# Patient Record
Sex: Female | Born: 1980 | Hispanic: No | Marital: Single | Smoking: Never smoker
Health system: Southern US, Community
[De-identification: ages and names within clinical notes are randomized; demographics above are authoritative.]

## PROBLEM LIST (undated history)

## (undated) DIAGNOSIS — R7303 Prediabetes: Principal | ICD-10-CM

## (undated) DIAGNOSIS — Z8759 Personal history of other complications of pregnancy, childbirth and the puerperium: Secondary | ICD-10-CM

## (undated) DIAGNOSIS — H35413 Lattice degeneration of retina, bilateral: Secondary | ICD-10-CM

## (undated) HISTORY — DX: Lattice degeneration of retina, bilateral: H35.413

## (undated) HISTORY — DX: Prediabetes: R73.03

## (undated) HISTORY — DX: Personal history of other complications of pregnancy, childbirth and the puerperium: Z87.59

## (undated) HISTORY — PX: NO PAST SURGERIES: SHX2092

---

## 2001-02-11 ENCOUNTER — Other Ambulatory Visit: Admission: RE | Admit: 2001-02-11 | Discharge: 2001-02-11 | Payer: Self-pay | Admitting: *Deleted

## 2001-09-10 ENCOUNTER — Other Ambulatory Visit: Admission: RE | Admit: 2001-09-10 | Discharge: 2001-09-10 | Payer: Self-pay | Admitting: Obstetrics and Gynecology

## 2003-04-14 ENCOUNTER — Other Ambulatory Visit: Admission: RE | Admit: 2003-04-14 | Discharge: 2003-04-14 | Payer: Self-pay | Admitting: Obstetrics and Gynecology

## 2004-04-25 ENCOUNTER — Other Ambulatory Visit: Admission: RE | Admit: 2004-04-25 | Discharge: 2004-04-25 | Payer: Self-pay | Admitting: Obstetrics and Gynecology

## 2005-05-02 ENCOUNTER — Other Ambulatory Visit: Admission: RE | Admit: 2005-05-02 | Discharge: 2005-05-02 | Payer: Self-pay | Admitting: Obstetrics and Gynecology

## 2006-02-07 ENCOUNTER — Ambulatory Visit: Payer: Self-pay | Admitting: Internal Medicine

## 2006-10-09 ENCOUNTER — Ambulatory Visit: Payer: Self-pay | Admitting: Internal Medicine

## 2006-10-09 LAB — CONVERTED CEMR LAB
AST: 21 units/L (ref 0–37)
Calcium: 9.7 mg/dL (ref 8.4–10.5)
Chlamydia, DNA Probe: NEGATIVE
Chloride: 102 meq/L (ref 96–112)
Cholesterol: 198 mg/dL (ref 0–200)
Creatinine, Ser: 0.8 mg/dL (ref 0.4–1.2)
GC Probe Amp, Genital: NEGATIVE
GFR calc non Af Amer: 91 mL/min
Glomerular Filtration Rate, Af Am: 111 mL/min/{1.73_m2}
Hemoglobin, Urine: NEGATIVE
Hemoglobin: 12.9 g/dL (ref 12.0–15.0)
Ketones, ur: NEGATIVE mg/dL
Nitrite: NEGATIVE
Protein, ur: NEGATIVE mg/dL
Sodium: 135 meq/L (ref 135–145)
TSH: 1.54 microintl units/mL (ref 0.35–5.50)
Urobilinogen, UA: 0.2 (ref 0.0–1.0)

## 2007-04-15 ENCOUNTER — Encounter (INDEPENDENT_AMBULATORY_CARE_PROVIDER_SITE_OTHER): Payer: Self-pay | Admitting: *Deleted

## 2007-04-15 ENCOUNTER — Ambulatory Visit: Payer: Self-pay | Admitting: Internal Medicine

## 2007-04-15 DIAGNOSIS — S139XXA Sprain of joints and ligaments of unspecified parts of neck, initial encounter: Secondary | ICD-10-CM | POA: Insufficient documentation

## 2007-04-17 ENCOUNTER — Telehealth (INDEPENDENT_AMBULATORY_CARE_PROVIDER_SITE_OTHER): Payer: Self-pay | Admitting: *Deleted

## 2007-04-29 ENCOUNTER — Ambulatory Visit: Payer: Self-pay | Admitting: Internal Medicine

## 2007-05-01 ENCOUNTER — Encounter: Payer: Self-pay | Admitting: Internal Medicine

## 2007-05-30 ENCOUNTER — Encounter: Payer: Self-pay | Admitting: Internal Medicine

## 2007-08-04 ENCOUNTER — Encounter: Payer: Self-pay | Admitting: Internal Medicine

## 2008-02-03 ENCOUNTER — Ambulatory Visit: Payer: Self-pay | Admitting: Internal Medicine

## 2008-02-03 DIAGNOSIS — B009 Herpesviral infection, unspecified: Secondary | ICD-10-CM | POA: Insufficient documentation

## 2008-02-03 LAB — CONVERTED CEMR LAB
Beta hcg, urine, semiquantitative: NEGATIVE
Bilirubin Urine: NEGATIVE
Ketones, urine, test strip: NEGATIVE
Specific Gravity, Urine: >=1.03
pH: 5

## 2008-02-04 ENCOUNTER — Encounter: Payer: Self-pay | Admitting: Internal Medicine

## 2008-02-04 LAB — CONVERTED CEMR LAB
Bacteria, UA: NONE SEEN
RBC / HPF: NONE SEEN (ref <3)

## 2008-05-04 ENCOUNTER — Ambulatory Visit: Payer: Self-pay | Admitting: Internal Medicine

## 2008-05-05 ENCOUNTER — Encounter (INDEPENDENT_AMBULATORY_CARE_PROVIDER_SITE_OTHER): Payer: Self-pay | Admitting: *Deleted

## 2008-05-05 LAB — CONVERTED CEMR LAB
BUN: 10 mg/dL (ref 6–23)
Chloride: 104 meq/L (ref 96–112)
Direct LDL: 132.6 mg/dL
GFR calc Af Amer: 96 mL/min
GFR calc non Af Amer: 79 mL/min
Glucose, Bld: 89 mg/dL (ref 70–99)
Potassium: 4.8 meq/L (ref 3.5–5.1)
Sodium: 138 meq/L (ref 135–145)
TSH: 1.93 microintl units/mL (ref 0.35–5.50)
VLDL: 13 mg/dL (ref 0–40)
Vit D, 1,25-Dihydroxy: 9 — ABNORMAL LOW (ref 30–89)

## 2008-05-10 ENCOUNTER — Telehealth (INDEPENDENT_AMBULATORY_CARE_PROVIDER_SITE_OTHER): Payer: Self-pay | Admitting: *Deleted

## 2008-05-11 ENCOUNTER — Ambulatory Visit: Payer: Self-pay | Admitting: Internal Medicine

## 2009-09-25 ENCOUNTER — Encounter: Payer: Self-pay | Admitting: Internal Medicine

## 2010-08-30 DIAGNOSIS — R7303 Prediabetes: Secondary | ICD-10-CM

## 2010-08-30 DIAGNOSIS — E119 Type 2 diabetes mellitus without complications: Secondary | ICD-10-CM | POA: Insufficient documentation

## 2010-08-30 HISTORY — DX: Prediabetes: R73.03

## 2010-09-14 ENCOUNTER — Ambulatory Visit: Payer: Self-pay | Admitting: Internal Medicine

## 2010-09-14 ENCOUNTER — Encounter: Payer: Self-pay | Admitting: Internal Medicine

## 2010-09-18 LAB — CONVERTED CEMR LAB
ALT: 15 units/L (ref 0–35)
AST: 19 units/L (ref 0–37)
Alkaline Phosphatase: 45 units/L (ref 39–117)
BUN: 15 mg/dL (ref 6–23)
Basophils Absolute: 0 10*3/uL (ref 0.0–0.1)
Bilirubin, Direct: 0 mg/dL (ref 0.0–0.3)
Chloride: 105 meq/L (ref 96–112)
Cholesterol: 188 mg/dL (ref 0–200)
Creatinine, Ser: 0.9 mg/dL (ref 0.4–1.2)
Eosinophils Relative: 3.2 % (ref 0.0–5.0)
GFR calc non Af Amer: 74.64 mL/min (ref >60.00)
HCT: 38.5 % (ref 36.0–46.0)
Hgb A1c MFr Bld: 6.2 % (ref 4.6–6.5)
LDL Cholesterol: 122 mg/dL — ABNORMAL HIGH (ref 0–99)
Lymphocytes Relative: 54.4 % — ABNORMAL HIGH (ref 12.0–46.0)
Lymphs Abs: 2.4 10*3/uL (ref 0.7–4.0)
Monocytes Relative: 7.1 % (ref 3.0–12.0)
Neutrophils Relative %: 34.7 % — ABNORMAL LOW (ref 43.0–77.0)
Platelets: 314 10*3/uL (ref 150.0–400.0)
RDW: 13.3 % (ref 11.5–14.6)
Total Bilirubin: 0.6 mg/dL (ref 0.3–1.2)
Total CHOL/HDL Ratio: 3
VLDL: 10.4 mg/dL (ref 0.0–40.0)
WBC: 4.5 10*3/uL (ref 4.5–10.5)

## 2010-09-27 ENCOUNTER — Encounter: Payer: Self-pay | Admitting: Internal Medicine

## 2010-10-30 NOTE — Letter (Signed)
Summary: [facility]  [facility]   Imported By: Lanelle Bal 10/09/2009 09:47:39  _____________________________________________________________________  External Attachment:    Type:   Image     Comment:   External Document

## 2010-11-01 NOTE — Letter (Signed)
Summary: Biometric Screening Form/[facility]  Biometric Screening Form/[facility]   Imported By: Lanelle Bal 09/27/2010 13:42:26  _____________________________________________________________________  External Attachment:    Type:   Image     Comment:   External Document

## 2010-11-01 NOTE — Assessment & Plan Note (Signed)
Summary: CPX and fasting labs///[facility]   Vital Signs:  Patient profile:   30 year old female Height:      65 inches Weight:      205.38 pounds BMI:     34.30 Pulse rate:   86 / minute Pulse rhythm:   regular BP sitting:   128 / 86  (left arm) Cuff size:   large  Vitals Entered By: Army Fossa CMA (September 14, 2010 9:28 AM) CC: CPX, fasting  Comments refill ibuprofen?  [facility] due for pap- has a gyn   History of Present Illness: complete physical exam Patient is concerned about her weight gain , she also has central obesity and wonders if she has a underlying medical condition  still has neck pain from time to time, saw  orthopedic surgery in early 2011, currently on ibuprofen as needed  Preventive Screening-Counseling & Management  Caffeine-Diet-Exercise     Does Patient Exercise: no  Allergies: No Known Drug Allergies  Past History:  Past Medical History: Reviewed history from 05/04/2008 and no changes required. G1 P0 abortion x 1 genital herpes  Past Surgical History: Reviewed history from 05/04/2008 and no changes required. no  Family History: CAD - MGF, F family HTN - M, F DM - M, MGM, PGF stroke - MGF MI-GF colon Ca - P great aunt/uncle (dx at age? 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 CV:  Denies chest pain or discomfort, palpitations, and swelling of feet. Resp:  Denies cough and shortness of breath. GI:  Denies bloody stools, nausea, and vomiting. Psych:  ++ stress at work .  Physical Exam  General:  alert, well-developed, and overweight-appearing.   Neck:  no masses and no thyromegaly.   Lungs:   normal respiratory effort, no intercostal retractions, no accessory muscle use, and normal breath sounds.   Heart:  normal rate, regular rhythm, and no murmur.   Abdomen:  soft, non-tender, no distention, no masses, no guarding, and no rigidity.   Extremities:  no pretibial edema bilaterally  Psych:  Oriented X3, memory intact for recent and remote, normally interactive, good eye contact, not anxious appearing, and not depressed appearing.     Impression & Recommendations:  Problem # 1:  HEALTH SCREENING (ICD-V70.0) Td 09 flu shot ---declined , explained the benefits  due to  see  Gyn, encouraged to take an appointment  Discussed her BMI, diet; Weight Watchers? Calorie counting? Also discussed exercise Form for her job filled labs including a hemoglobin A1c and a cotinine per her job requirements   Orders: Venipuncture (16109) TLB-BMP (Basic Metabolic Panel-BMET) (80048-METABOL) TLB-A1C / Hgb A1C (Glycohemoglobin) (83036-A1C) TLB-CBC Platelet - w/Differential (85025-CBCD) TLB-Hepatic/Liver Function Pnl (80076-HEPATIC) TLB-Lipid Panel (80061-LIPID) TLB-TSH (Thyroid Stimulating Hormone) (84443-TSH) Specimen Handling (60454) T- * Misc. Laboratory test [phone])  Problem # 2:  CERVICAL MUSCLE STRAIN (ICD-847.0) saw ortho  early 2011, currently on ibuprofen as needed and Flexeril rarely d/t . drowsiness Rx for 600 and 800 motrin done, likes to have both and use 800mg  only  if symptoms severe. GI s/e discussed  Her updated medication list for this problem includes:    Ibuprofen 800  Mg Tabs (Ibuprofen) .Marland Kitchen... 1 by mouth three times a day as needed , take w/  food    Flexeril 10 Mg Tabs (Cyclobenzaprine hcl)    Ibuprofen 600 Mg Tabs (Ibuprofen) .Marland Kitchen... 1 by mouth three times a day as needed  Complete Medication List: 1)  Valtrex 1 Gm Tabs (Valacyclovir hcl) .... 2 by mouth two times a day x 1 day as needed fever blisters 2)  Ibuprofen 800 Mg Tabs (Ibuprofen) .Marland Kitchen.. 1 by mouth three times a  day as needed , take w/  food 3)  Flexeril 10 Mg Tabs (Cyclobenzaprine hcl) 4)  Ibuprofen 600 Mg Tabs (Ibuprofen) .Marland Kitchen.. 1 by mouth three times a day as needed  Patient Instructions: 1)  Please schedule a follow-up appointment in 1 year.  Prescriptions: IBUPROFEN 600 MG TABS (IBUPROFEN) 1 by mouth three times a day as needed  #90 x 1   Entered and Authorized by:   Elita Quick E. Paz MD   Signed by:   Nolon Rod. Paz MD on 09/14/2010   Method used:   Print then Give to Patient   RxID:   [id number] IBUPROFEN 800 MG TABS (IBUPROFEN) 1 by mouth three times a day as needed , take w/  food  #90 x 1   Entered and Authorized by:   Elita Quick E. Paz MD   Signed by:   Nolon Rod. Paz MD on 09/14/2010   Method used:   Print then Give to Patient   RxID:   [id number]    Orders Added: 1)  Venipuncture [84696] 2)  TLB-BMP (Basic Metabolic Panel-BMET) [80048-METABOL] 3)  TLB-A1C / Hgb A1C (Glycohemoglobin) [83036-A1C] 4)  TLB-CBC Platelet - w/Differential [85025-CBCD] 5)  TLB-Hepatic/Liver Function Pnl [80076-HEPATIC] 6)  TLB-Lipid Panel [80061-LIPID] 7)  TLB-TSH (Thyroid Stimulating Hormone) [84443-TSH] 8)  Specimen Handling [99000] 9)  T- * Misc. Laboratory test [99999] 10)  Est. Patient age 46-39 [36]     Risk Factors:  Exercise:  no

## 2010-11-01 NOTE — Letter (Signed)
Summary: Wellness Program Form/[facility]  Wellness Program Form/[facility]   Imported By: Lanelle Bal 10/08/2010 10:17:44  _____________________________________________________________________  External Attachment:    Type:   Image     Comment:   External Document

## 2010-12-03 ENCOUNTER — Encounter: Payer: Self-pay | Admitting: Internal Medicine

## 2010-12-03 ENCOUNTER — Ambulatory Visit (INDEPENDENT_AMBULATORY_CARE_PROVIDER_SITE_OTHER): Payer: 59 | Admitting: Internal Medicine

## 2010-12-03 DIAGNOSIS — J069 Acute upper respiratory infection, unspecified: Secondary | ICD-10-CM

## 2010-12-11 NOTE — Assessment & Plan Note (Signed)
Summary: cold for several days///sph   Vital Signs:  Patient profile:   30 year old female Weight:      210.25 pounds Temp:     98.3 degrees F oral Pulse rate:   81 / minute Pulse rhythm:   regular BP sitting:   118 / 82  (left arm) Cuff size:   large  Vitals Entered By: Army Fossa CMA (December 03, 2010 11:47 AM) CC: Pt here c/o hoarsness, cough, x 6 days.  Comments Tried OTC's Meds [facility]    History of Present Illness:  symptoms started 6 days ago Sore throat, hoarseness on and off, cough which is worse at night  Current Medications (verified): 1)  Valtrex 1 Gm  Tabs (Valacyclovir Hcl) .... 2 By Mouth Two Times A Day X 1 Day As Needed Fever Blisters 2)  Flexeril 10 Mg Tabs (Cyclobenzaprine Hcl) 3)  Ibuprofen 600 Mg Tabs (Ibuprofen) .Marland Kitchen.. 1 By Mouth Three Times A Day As Needed  Allergies (verified): No Known Drug Allergies  Past History:  Past Medical History: Reviewed history from 05/04/2008 and no changes required. G1 P0 abortion x 1 genital herpes  Past Surgical History: Reviewed history from 05/04/2008 and no changes required. no  Social History: Reviewed history from 09/14/2010 and no changes required. 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 ENT:  no PN drip. Resp:  Denies shortness of breath and wheezing; small amount of sputum, no color . GI:  Denies diarrhea and nausea; V x 1 from cough.  Physical Exam  General:  alert and well-developed.   Head:   face symmetric, nontender to palpation Ears:  R ear normal and L ear normal.   Nose:   not congested Mouth:   no redness or discharge Lungs:  normal respiratory effort, no intercostal retractions, no accessory muscle use, and normal breath sounds.   Heart:  normal rate, regular rhythm, and no murmur.     Impression &  Recommendations:  Problem # 1:  URI (ICD-465.9) see instructions, knows to avoid hydrocodone if she suspectes  pregnancy Her updated medication list for this problem includes:    Ibuprofen 600 Mg Tabs (Ibuprofen) .Marland Kitchen... 1 by mouth three times a day as needed    Hydromet 5-1.5 Mg/79ml Syrp (Hydrocodone-homatropine) .Marland Kitchen... 1 or 2 tsp by mouth at bedtime as needed pain  Complete Medication List: 1)  Valtrex 1 Gm Tabs (Valacyclovir hcl) .... 2 by mouth two times a day x 1 day as needed fever blisters 2)  Flexeril 10 Mg Tabs (Cyclobenzaprine hcl) 3)  Ibuprofen 600 Mg Tabs (Ibuprofen) .Marland Kitchen.. 1 by mouth three times a day as needed 4)  Hydromet 5-1.5 Mg/38ml Syrp (Hydrocodone-homatropine) .Marland Kitchen.. 1 or 2 tsp by mouth at bedtime as needed pain 5)  Amoxicillin 500 Mg Tabs (Amoxicillin) .... 2 by mouth two times a day  Patient Instructions: 1)  rest, fluids, tylenol 2)  mucinex DM two times a day x 1 week, then a needed 3)  hydrocodone syrup at night if needed for severe cough, will cause drowsiness 4)  if no better in 4 to 5 days, start amoxicillin  5)  call anytime if symptoms severe or no better in 10 days  Prescriptions: AMOXICILLIN 500 MG TABS (AMOXICILLIN) 2 by mouth two times a day  #28 x 0   Entered and  Authorized by:   Nolon Rod Andra Matsuo MD   Signed by:   Nolon Rod. Yoshie Kosel MD on 12/03/2010   Method used:   Print then Give to Patient   RxID:   [id number] HYDROMET 5-1.5 MG/5ML SYRP (HYDROCODONE-HOMATROPINE) 1 or 2 tsp by mouth at bedtime as needed pain  #150cc x 0   Entered and Authorized by:   Nolon Rod. Peachie Barkalow MD   Signed by:   Nolon Rod. Amberleigh Gerken MD on 12/03/2010   Method used:   Print then Give to Patient   RxID:   [id number]    Orders Added: 1)  Est. Patient Level III [84696]

## 2011-04-14 ENCOUNTER — Telehealth: Payer: Self-pay | Admitting: Internal Medicine

## 2011-04-14 NOTE — Telephone Encounter (Signed)
Arrange for a f/u A1C dx hyperglycemia

## 2011-04-16 NOTE — Telephone Encounter (Signed)
Pt scheduled lab.

## 2011-04-22 ENCOUNTER — Other Ambulatory Visit: Payer: Self-pay | Admitting: Internal Medicine

## 2011-04-22 DIAGNOSIS — R7989 Other specified abnormal findings of blood chemistry: Secondary | ICD-10-CM

## 2011-04-23 ENCOUNTER — Other Ambulatory Visit (INDEPENDENT_AMBULATORY_CARE_PROVIDER_SITE_OTHER): Payer: 59

## 2011-04-23 DIAGNOSIS — R7989 Other specified abnormal findings of blood chemistry: Secondary | ICD-10-CM

## 2011-04-23 LAB — HEMOGLOBIN A1C: Hgb A1c MFr Bld: 6.4 % (ref 4.6–6.5)

## 2011-04-23 NOTE — Progress Notes (Signed)
Labs only

## 2011-04-25 ENCOUNTER — Telehealth: Payer: Self-pay | Admitting: *Deleted

## 2011-04-25 NOTE — Telephone Encounter (Signed)
Message left for patient to return my call.  

## 2011-04-25 NOTE — Telephone Encounter (Signed)
Message copied by Leanne Lovely on Thu Apr 25, 2011  3:02 PM ------      Message from: Willow Ora E      Created: Thu Apr 25, 2011 12:29 PM       Advise patient:She has developed diabetes, her test came back positive.      Recommend:      1. Referral to nutritionist      2. Daily exercise for 30 minutes      3. Office visit in 3 months

## 2011-04-25 NOTE — Telephone Encounter (Signed)
Pt is aware, declines nutrition referral.

## 2011-04-26 ENCOUNTER — Telehealth: Payer: Self-pay | Admitting: Internal Medicine

## 2011-04-26 NOTE — Telephone Encounter (Signed)
Patient decided she would like to see nutritionist - please refer

## 2011-05-20 ENCOUNTER — Encounter: Payer: 59 | Attending: Internal Medicine | Admitting: *Deleted

## 2011-05-20 DIAGNOSIS — Z713 Dietary counseling and surveillance: Secondary | ICD-10-CM | POA: Insufficient documentation

## 2011-05-20 DIAGNOSIS — E119 Type 2 diabetes mellitus without complications: Secondary | ICD-10-CM | POA: Insufficient documentation

## 2011-05-21 NOTE — Patient Instructions (Signed)
Patient will attend Core Diabetes Courses as scheduled or follow up prn.  

## 2011-05-21 NOTE — Progress Notes (Signed)
  Patient was seen on 05/20/11 for the first of a series of three diabetes self-management courses at [facility]. The following learning objectives were met by the patient during this course:   Defines diabetes and the role of insulin  Identifies type of diabetes and pathophysiology  States normal BG range and personal goals  Identifies three risk factors for the development of diabetes  States the need for and frequency of healthcare follow up (ADA Standards of Care)   Patient has established the following initial goals:  Increase exercise  Lose weight  Work on managing stress  Follow-Up Plan: Attend [facility] Core Diabetes Courses

## 2011-06-11 ENCOUNTER — Encounter: Payer: 59 | Attending: Internal Medicine

## 2011-06-11 DIAGNOSIS — E119 Type 2 diabetes mellitus without complications: Secondary | ICD-10-CM | POA: Insufficient documentation

## 2011-06-11 DIAGNOSIS — Z713 Dietary counseling and surveillance: Secondary | ICD-10-CM | POA: Insufficient documentation

## 2011-06-12 NOTE — Progress Notes (Signed)
  Patient was seen on 06/11/11 for the second of a series of three diabetes self-management courses at the [facility]. The following learning objectives were met by the patient during this course:   States the relationship of exercise to blood glucose  States benefits/barriers of regular and safe exercise  States three guidelines for safe and effective exercise  Describes personal diabetes medicine regimen  Describes actions of own medications  Describes causes, symptoms, and treatment of hypo/hyperglycemia  Describes sick day rules  Identifies when to test urine for ketones when appropriate  Identifies when to call healthcare provider for acute complications  States the risk for problems with foot, skin, and dental care  States preventative foot, skin, and dental care measures  States when to call healthcare provider regarding foot, skin, and dental care  Identifies methods for evaluation of diabetes control  Discusses benefits of SBGM  Identifies relationship between nutrition, exercise, medication, and glucose levels  Discusses the importance of record keeping  *Patient received [facility] Core Program Notebook at class.  Follow-Up Plan: Patient will attend the final class of the ADA Diabetes Self-Care Education.   

## 2011-06-18 ENCOUNTER — Encounter: Payer: 59 | Admitting: Dietician

## 2011-06-19 NOTE — Progress Notes (Signed)
  Patient was seen on 06/18/2011 for the third of a series of three diabetes self-management courses at the [facility]. The following learning objectives were met by the patient during this course:   Identifies nutrient effects on glycemia  States the general guidelines of meal planning  Relates understanding of personal meal plan  Describes situations that cause stress and discuss methods of stress management  Identifies lifestyle behaviors for change  The following handouts were given in class:  [employer] Carbohydrate Counting book  3 Month Follow Up Visit handout  Goal setting handout  Class evaluation form  Your patient has established the following 3 month goal for diabetes self-care:  Loose 15 lbs over the nest 3 months  Follow-Up Plan: Patient will attend a 3 month follow-up visit for diabetes self-management education.

## 2011-06-24 ENCOUNTER — Encounter: Payer: 59 | Admitting: Internal Medicine

## 2011-06-25 ENCOUNTER — Encounter: Payer: 59 | Admitting: Internal Medicine

## 2011-07-10 ENCOUNTER — Encounter: Payer: Self-pay | Admitting: Internal Medicine

## 2011-07-11 ENCOUNTER — Ambulatory Visit (INDEPENDENT_AMBULATORY_CARE_PROVIDER_SITE_OTHER): Payer: 59 | Admitting: Internal Medicine

## 2011-07-11 ENCOUNTER — Encounter: Payer: Self-pay | Admitting: Internal Medicine

## 2011-07-11 DIAGNOSIS — O24919 Unspecified diabetes mellitus in pregnancy, unspecified trimester: Secondary | ICD-10-CM

## 2011-07-11 DIAGNOSIS — E119 Type 2 diabetes mellitus without complications: Secondary | ICD-10-CM

## 2011-07-11 DIAGNOSIS — Z0000 Encounter for general adult medical examination without abnormal findings: Secondary | ICD-10-CM

## 2011-07-11 LAB — LIPID PANEL
Cholesterol: 192 mg/dL (ref 0–200)
HDL: 60.5 mg/dL (ref >39.00)
LDL Cholesterol: 120 mg/dL — ABNORMAL HIGH (ref 0–99)
Triglycerides: 58 mg/dL (ref 0.0–149.0)
VLDL: 11.6 mg/dL (ref 0.0–40.0)

## 2011-07-11 LAB — BASIC METABOLIC PANEL
CO2: 24 mEq/L (ref 19–32)
Calcium: 9.4 mg/dL (ref 8.4–10.5)
Chloride: 106 mEq/L (ref 96–112)
Glucose, Bld: 88 mg/dL (ref 70–99)
Potassium: 4.5 mEq/L (ref 3.5–5.1)
Sodium: 137 mEq/L (ref 135–145)

## 2011-07-11 LAB — MICROALBUMIN / CREATININE URINE RATIO: Microalb Creat Ratio: 0.2 mg/g (ref 0.0–30.0)

## 2011-07-11 LAB — AST: AST: 17 U/L (ref 0–37)

## 2011-07-11 NOTE — Progress Notes (Signed)
  Subjective:    Patient ID: Rachel Goodwin, female    DOB: [birth year], 30 y.o.   MRN: [id number]  HPI Requests a complete physical exam, needs a form completed  Past Medical History  Diagnosis Date  . Abortion history     x1  . Genital herpes   . Diabetes mellitus 08/2010    A1c 6.2  09/18/2010   No past surgical history on file. History   Social History  . Marital Status: Single    Spouse Name: N/A    Number of Children: 0  . 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 Smoking status: Never Smoker   . Smokeless tobacco: Never Used  . Alcohol Use: Yes     socially   . Drug Use: No  . Sexually Active: Not on file   Other Topics Concern  . Not on file   Social History Narrative   EXERCISE: better , cardio class, active 4 times a week---DIET: saw a nutritionist, doing [employer]   Family History  Problem Relation Age of Onset  . Hypertension      M ,  F, Gparents   . Diabetes Mother   . Hyperlipidemia Father   . Coronary artery disease      Gparents, uncles, age of onset?  . Stroke Maternal Grandfather   . Thyroid disease Paternal Grandmother   . Colon cancer Neg Hx   . Breast cancer      great aunt     Review of Systems  Constitutional: Negative for fever and fatigue.  Respiratory: Negative for cough and shortness of breath.   Cardiovascular: Negative for chest pain, palpitations and leg swelling.  Gastrointestinal: Negative for abdominal pain and blood in stool.  Genitourinary: Negative for dysuria, hematuria and difficulty urinating.  Psychiatric/Behavioral:       No anxiety depression       Objective:   Physical Exam  Constitutional: She is oriented to person, place, and time. She appears well-developed and well-nourished. No distress.  HENT:  Head: Normocephalic and atraumatic.  Neck: No thyromegaly present.  Cardiovascular: Normal rate, regular rhythm and normal heart sounds.   No  murmur heard. Pulmonary/Chest: Effort normal and breath sounds normal. No respiratory distress. She has no wheezes. She has no rales.  Abdominal: Soft. She exhibits no distension. There is no tenderness. There is no rebound and no guarding.  Musculoskeletal: She exhibits no edema.  Neurological: She is alert and oriented to person, place, and time.  Skin: Skin is warm and dry. She is not diaphoretic.  Psychiatric: She has a normal mood and affect. Her behavior is normal. Judgment and thought content normal.          Assessment & Plan:

## 2011-07-11 NOTE — Assessment & Plan Note (Signed)
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 Has lost 12 pounds since the diagnosis. I talked to her about A1c and CBG goals, glucometer provided, to check twice a week as needed.

## 2011-07-11 NOTE — Assessment & Plan Note (Addendum)
Td 2009 Recommend a flu shot , benefits explained  Reports she sees gyn yearly  Labs Encouraged to continue with her healthier life is

## 2011-07-12 LAB — NICOTINE/COTININE METABOLITES: Cotinine: <10 ng/mL

## 2011-08-12 HISTORY — PX: RETINAL LASER PROCEDURE: SHX2339

## 2011-10-18 HISTORY — PX: RETINAL LASER PROCEDURE: SHX2339

## 2012-06-23 ENCOUNTER — Ambulatory Visit (INDEPENDENT_AMBULATORY_CARE_PROVIDER_SITE_OTHER): Payer: 59 | Admitting: Internal Medicine

## 2012-06-23 ENCOUNTER — Encounter: Payer: Self-pay | Admitting: Internal Medicine

## 2012-06-23 ENCOUNTER — Encounter: Payer: Self-pay | Admitting: *Deleted

## 2012-06-23 VITALS — BP 118/80 | HR 78 | Temp 98.6°F | Wt 196.0 lb

## 2012-06-23 DIAGNOSIS — S91309A Unspecified open wound, unspecified foot, initial encounter: Secondary | ICD-10-CM

## 2012-06-23 MED ORDER — CEPHALEXIN 500 MG PO CAPS: 500.0000 mg | ORAL_CAPSULE | Freq: Four times a day (QID) | ORAL | Status: DC

## 2012-06-23 NOTE — Patient Instructions (Addendum)
Okay to use soap and water patted it dry Use an antibiotic over-the-counter cream Keflex for 5 days Come back for a wound check and hopefully stitch removal in 6 days Call anytime if  redness, swelling, discharge

## 2012-06-23 NOTE — Progress Notes (Signed)
  Subjective:    Patient ID: Rachel Goodwin, female    DOB: [birth year], 31 y.o.   MRN: [id number]  HPI Acute visit 3 days ago was at a mall @ [address] ([place]), cut her little left toe in the bathroom, went to urgent care, received 3 stitches, was prescribed  NSAIDs,a tramadol like medicine and a cream. She is here for followup.  Past Medical History: Reviewed history from 05/04/2008 and no changes required. G1 P0, abortion x 1 genital herpes Diabetes mellitus A1C --->  6.2 (2011)  Past Surgical History: no  Family History: CAD - MGF, F family HTN - M, F DM - M, MGM, PGF stroke - MGF MI-GF colon Ca - P great aunt/uncle (dx at age? 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 no fever or chills She still has pain, but does not think needs strong pain medication.    Objective:   Physical Exam  Constitutional: She appears well-developed. No distress.  Musculoskeletal: She exhibits no edema.       Feet:  Skin: She is not diaphoretic.          Assessment & Plan:  Wound,   Local care with soap and water, pat it dry, OTC cream. 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 See instructions

## 2012-06-29 ENCOUNTER — Encounter: Payer: Self-pay | Admitting: *Deleted

## 2012-06-29 ENCOUNTER — Ambulatory Visit (INDEPENDENT_AMBULATORY_CARE_PROVIDER_SITE_OTHER): Payer: 59 | Admitting: Internal Medicine

## 2012-06-29 VITALS — BP 118/72 | HR 80 | Temp 97.8°F | Wt 196.0 lb

## 2012-06-29 DIAGNOSIS — S91109A Unspecified open wound of unspecified toe(s) without damage to nail, initial encounter: Secondary | ICD-10-CM

## 2012-06-29 NOTE — Progress Notes (Signed)
  Subjective:    Patient ID: Rachel Goodwin, female    DOB: September 26, 1981, 31 y.o.   MRN: [id number]  HPI Here for a wound check and stitches removal Reports no redness or discharge Have some stomach issues with antibiotics  Review of Systems     Objective:   Physical Exam  Wound without redness or discharge, 2 of the stitches are hardly holding anything.      Assessment & Plan:  I removed the 3 stitches, wound looks good. Okay to go back to work Keep area clean and dry and use an antibiotic ointment

## 2012-07-16 ENCOUNTER — Ambulatory Visit (INDEPENDENT_AMBULATORY_CARE_PROVIDER_SITE_OTHER): Payer: 59 | Admitting: Internal Medicine

## 2012-07-16 VITALS — BP 116/82 | HR 84 | Temp 98.2°F | Ht 65.25 in | Wt 194.0 lb

## 2012-07-16 DIAGNOSIS — Z0000 Encounter for general adult medical examination without abnormal findings: Secondary | ICD-10-CM

## 2012-07-16 DIAGNOSIS — M542 Cervicalgia: Secondary | ICD-10-CM | POA: Insufficient documentation

## 2012-07-16 DIAGNOSIS — E119 Type 2 diabetes mellitus without complications: Secondary | ICD-10-CM

## 2012-07-16 MED ORDER — IBUPROFEN 800 MG PO TABS: 800.0000 mg | ORAL_TABLET | Freq: Three times a day (TID) | ORAL | Status: DC | PRN

## 2012-07-16 MED ORDER — CYCLOBENZAPRINE HCL 10 MG PO TABS: 10.0000 mg | ORAL_TABLET | Freq: Every evening | ORAL | Status: DC | PRN

## 2012-07-16 MED ORDER — IBUPROFEN 600 MG PO TABS: 600.0000 mg | ORAL_TABLET | Freq: Three times a day (TID) | ORAL | Status: DC | PRN

## 2012-07-16 NOTE — Progress Notes (Signed)
  Subjective:    Patient ID: Rachel Goodwin, female    DOB: 01-09-1981, 31 y.o.   MRN: [id number]  HPI CPX  Past Medical History: G1 P0, abortion x 1 genital herpes Diabetes mellitus A1C --->  6.2 (2011)  Past Surgical History: no  Family History: CAD - MGF, F family HTN - M, F DM - M, MGM, PGF stroke - MGF MI-GF high cholesterol - F Thyroid dz - PGM colon Ca - P great aunt/uncle (dx at age? 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 Has occasional neck and back pain, see assessment and plan    Objective:   Physical Exam General -- alert, well-developed, and slt  overweight appearing. No apparent distress.  Neck --no thyromegaly, normal carotid pulses Lungs -- normal respiratory effort, no intercostal retractions, no accessory muscle use, and normal breath sounds.   Heart-- normal rate, regular rhythm, no murmur, and no gallop.   Abdomen--soft, non-tender, no distention, no masses, no HSM, no guarding, and no rigidity.   Extremities-- no pretibial edema bilaterally  Neurologic-- alert & oriented X3 and strength normal in all extremities. Psych-- Cognition and judgment appear intact. Alert and cooperative with normal attention span and concentration.  not anxious appearing and not depressed appearing.       Assessment & Plan:   Paperwork for FMLA completed Paperwork for work will be completed with lab results

## 2012-07-16 NOTE — Assessment & Plan Note (Signed)
Labs Diet and exercise discussed, return to the office in 6

## 2012-07-16 NOTE — Assessment & Plan Note (Signed)
Occasional neck and back pain, likes to have a prescription for ibuprofen and Flexeril to use as needed.

## 2012-07-16 NOTE — Assessment & Plan Note (Addendum)
Td 2009 Recommend a flu shot , benefits explained  Sees gyn yearly, last visit 2012  Labs including a nicotine test per work requirements Encouraged to continue with her healthier lifestyle

## 2012-07-17 LAB — COMPREHENSIVE METABOLIC PANEL
ALT: 17 U/L (ref 0–35)
AST: 19 U/L (ref 0–37)
CO2: 25 mEq/L (ref 19–32)
Calcium: 9.2 mg/dL (ref 8.4–10.5)
Chloride: 105 mEq/L (ref 96–112)
GFR: 71.12 mL/min (ref >60.00)
Potassium: 4.3 mEq/L (ref 3.5–5.1)
Sodium: 136 mEq/L (ref 135–145)
Total Protein: 7.4 g/dL (ref 6.0–8.3)

## 2012-07-17 LAB — CBC WITH DIFFERENTIAL/PLATELET
Basophils Absolute: 0 10*3/uL (ref 0.0–0.1)
Eosinophils Absolute: 0.1 10*3/uL (ref 0.0–0.7)
Lymphocytes Relative: 55.7 % — ABNORMAL HIGH (ref 12.0–46.0)
Lymphs Abs: 2.4 10*3/uL (ref 0.7–4.0)
MCHC: 32.2 g/dL (ref 30.0–36.0)
Monocytes Relative: 6.1 % (ref 3.0–12.0)
Neutro Abs: 1.5 10*3/uL (ref 1.4–7.7)
Platelets: 272 10*3/uL (ref 150.0–400.0)
RDW: 13.5 % (ref 11.5–14.6)

## 2012-07-17 LAB — NICOTINE/COTININE METABOLITES: Cotinine: <10 ng/mL

## 2012-07-17 LAB — LIPID PANEL
HDL: 56.1 mg/dL (ref >39.00)
Total CHOL/HDL Ratio: 4

## 2012-07-17 LAB — HEMOGLOBIN A1C: Hgb A1c MFr Bld: 6.1 % (ref 4.6–6.5)

## 2012-07-17 LAB — TSH: TSH: 1.1 u[IU]/mL (ref 0.35–5.50)

## 2012-07-18 ENCOUNTER — Encounter: Payer: Self-pay | Admitting: Internal Medicine

## 2012-07-20 LAB — VITAMIN D 1,25 DIHYDROXY
Vitamin D2 1, 25 (OH)2: <8 pg/mL
Vitamin D3 1, 25 (OH)2: 63 pg/mL

## 2012-07-23 ENCOUNTER — Encounter: Payer: Self-pay | Admitting: *Deleted

## 2012-07-24 ENCOUNTER — Telehealth: Payer: Self-pay

## 2012-07-24 NOTE — Telephone Encounter (Signed)
Pt states returning your call, pt states plz fax form a fax number should be at top of form.   MW

## 2012-07-24 NOTE — Telephone Encounter (Signed)
Ok. Form faxed.

## 2013-01-19 ENCOUNTER — Ambulatory Visit: Payer: 59 | Admitting: Internal Medicine

## 2013-01-27 ENCOUNTER — Encounter: Payer: Self-pay | Admitting: Lab

## 2013-01-28 ENCOUNTER — Ambulatory Visit (INDEPENDENT_AMBULATORY_CARE_PROVIDER_SITE_OTHER): Payer: 59 | Admitting: Internal Medicine

## 2013-01-28 ENCOUNTER — Encounter: Payer: Self-pay | Admitting: *Deleted

## 2013-01-28 ENCOUNTER — Encounter: Payer: Self-pay | Admitting: Internal Medicine

## 2013-01-28 VITALS — BP 110/76 | HR 69 | Wt 192.0 lb

## 2013-01-28 DIAGNOSIS — R7309 Other abnormal glucose: Secondary | ICD-10-CM

## 2013-01-28 DIAGNOSIS — M542 Cervicalgia: Secondary | ICD-10-CM

## 2013-01-28 DIAGNOSIS — R7303 Prediabetes: Secondary | ICD-10-CM

## 2013-01-28 NOTE — Progress Notes (Signed)
  Subjective:    Patient ID: Rachel Goodwin, female    DOB: 29-Jun-1981, 32 y.o.   MRN: [id number]  HPI ROV Reports she is doing well, continues to be very active, room for improvement on her diet. She is concerned about the low white cells, issue discussed, plan is to recheck from time to time.   Past Medical History  Diagnosis Date  . Abortion history     x1  . Genital herpes   . Prediabetes 08/2010    A1c 6.2  09/18/2010   Past Surgical History  Procedure Laterality Date  . No past surgeries      History   Social History  . Marital Status: Single    Spouse Name: N/A    Number of Children: 0  . 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 Smoking status: Never Smoker   . Smokeless tobacco: Never Used  . Alcohol Use: Yes     Comment: socially   . Drug Use: No  . Sexually Active: Not on file   Other Topics Concern  . Not on file   Social History Narrative   EXERCISE: better , cardio class, active 4 times a week---DIET: saw a nutritionist, doing [employer]    History   Social History  . Marital Status: Single    Spouse Name: N/A    Number of Children: 0  . 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 Smoking status: Never Smoker   . Smokeless tobacco: Never Used  . Alcohol Use: Yes     Comment: socially   . Drug Use: No  . Sexually Active: Not on file   Other Topics Concern  . Not on file   Social History Narrative   Exercise-- keeps very active, doing well   DIET--saw a nutritionist before, room for improvement          Review of Systems Denies any blurred vision. No nausea, vomiting, diarrhea    Objective:   Physical Exam  General -- alert, well-developed, No apparent distress Neck --no thyromegaly Lungs -- normal respiratory effort, no intercostal retractions, no accessory muscle use, and normal breath sounds.    Heart-- normal rate, regular rhythm, no murmur, and no gallop.    Psych-- Cognition and judgment appear intact. Alert and cooperative with normal attention span and concentration.  not anxious appearing and not depressed appearing.       Assessment & Plan:

## 2013-01-28 NOTE — Assessment & Plan Note (Signed)
Patient with prediabetes, last A1c 6.1. We discussed the fact that taking metformin in addition to a healthy lifestyle is an option to prevent worsening of her A1c. She is not very enthusiastic  about taking any medications. She also wonders about her cholesterol. LDL is 131. At some point we may consider medication as well. Plan: Labs , continue working on her lifestyle

## 2013-01-28 NOTE — Patient Instructions (Signed)
   Consider metformin for prediabetes

## 2013-01-28 NOTE — Assessment & Plan Note (Signed)
Well-controlled with Motrin and Flexeril as needed

## 2013-02-15 ENCOUNTER — Encounter: Payer: Self-pay | Admitting: Internal Medicine

## 2013-08-04 ENCOUNTER — Telehealth: Payer: Self-pay

## 2013-08-04 NOTE — Telephone Encounter (Signed)
Left message for call back Non identifiable  Increased lymphocyte--10/2011

## 2013-08-04 NOTE — Telephone Encounter (Signed)
Medication and allergies: reviewed and updated  90 day supply/mail order: na Local pharmacy: [facility]   Immunizations due:  Not sure on flu vaccine  A/P:   No changes to FH or PSH PAP--gyn--Dr Anderson--04/2013--neg per patient Increased lymphocyte--10/2011  To Discuss with Provider: DM--medications Sleeping issues Form for work--nicotine test

## 2013-08-05 ENCOUNTER — Ambulatory Visit (INDEPENDENT_AMBULATORY_CARE_PROVIDER_SITE_OTHER): Payer: 59 | Admitting: Internal Medicine

## 2013-08-05 ENCOUNTER — Encounter: Payer: Self-pay | Admitting: Internal Medicine

## 2013-08-05 VITALS — BP 112/77 | HR 73 | Temp 98.4°F | Wt 196.0 lb

## 2013-08-05 DIAGNOSIS — R7303 Prediabetes: Secondary | ICD-10-CM

## 2013-08-05 DIAGNOSIS — R7309 Other abnormal glucose: Secondary | ICD-10-CM

## 2013-08-05 DIAGNOSIS — G4700 Insomnia, unspecified: Secondary | ICD-10-CM

## 2013-08-05 DIAGNOSIS — Z0000 Encounter for general adult medical examination without abnormal findings: Secondary | ICD-10-CM

## 2013-08-05 LAB — COMPREHENSIVE METABOLIC PANEL WITH GFR
ALT: 14 U/L (ref 0–35)
AST: 18 U/L (ref 0–37)
Albumin: 4.3 g/dL (ref 3.5–5.2)
Alkaline Phosphatase: 40 U/L (ref 39–117)
BUN: 13 mg/dL (ref 6–23)
CO2: 23 meq/L (ref 19–32)
Calcium: 9.6 mg/dL (ref 8.4–10.5)
Chloride: 105 meq/L (ref 96–112)
Creatinine, Ser: 0.9 mg/dL (ref 0.4–1.2)
GFR: 77.12 mL/min
Glucose, Bld: 84 mg/dL (ref 70–99)
Potassium: 4.5 meq/L (ref 3.5–5.1)
Sodium: 135 meq/L (ref 135–145)
Total Bilirubin: 0.6 mg/dL (ref 0.3–1.2)
Total Protein: 8 g/dL (ref 6.0–8.3)

## 2013-08-05 LAB — LDL CHOLESTEROL, DIRECT: Direct LDL: 134.5 mg/dL

## 2013-08-05 LAB — LIPID PANEL
Cholesterol: 215 mg/dL — ABNORMAL HIGH (ref 0–200)
VLDL: 11.2 mg/dL (ref 0.0–40.0)

## 2013-08-05 LAB — HEMOGLOBIN A1C: Hgb A1c MFr Bld: 6.2 % (ref 4.6–6.5)

## 2013-08-05 NOTE — Progress Notes (Signed)
  Subjective:    Patient ID: Rachel Goodwin, female    DOB: [birth year], 31 y.o.   MRN: [id number]  HPI Physical exam Also likes to discuss possibly medication for diabetes, last A1c 6.3. History of difficulty sleeping for 2 years, problems are on and off," I don't feel I get a full night of rest", has been told in the past she snores some, occasionally gets sleepy throughout the day. Denies anxiety or depression per se.  Past Medical History  Diagnosis Date  . Abortion history     x1  . Genital herpes   . Prediabetes 08/2010    A1c 6.2  09/18/2010   Past Surgical History  Procedure Laterality Date  . No past surgeries     History   Social History  . Marital Status: Single    Spouse Name: N/A    Number of Children: 0  . 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 Smoking status: Never Smoker   . Smokeless tobacco: Never Used  . Alcohol Use: Yes     Comment: socially   . Drug Use: No  . Sexual Activity: Not on file   Other Topics Concern  . Not on file   Social History Narrative   Lives by herself            Family History  Problem Relation Age of Onset  . Hypertension Mother     M ,  F, Gparents   . Hyperlipidemia Father   . Coronary artery disease Other     Gparents, uncles, age of onset?  . Stroke Maternal Grandfather   . Thyroid disease Paternal Grandmother   . Colon cancer Neg Hx   . Breast cancer Other     great aunt     Review of Systems Diet-- trying to eat better, doing [facility] Exercise-- active on-off  No  CP, SOB, lower extremity edema Denies  nausea, vomiting diarrhea Denies  blood in the stools No GERD  Sx. (-) cough, sputum production No dysuria, gross hematuria, difficulty urinating         Objective:   Physical Exam BP 112/77  Pulse 73  Temp(Src) 98.4 F (36.9 C)  Wt 196 lb (88.905 kg)  SpO2 98% General -- alert, well-developed, NAD.  Neck --no thyromegaly Lungs  -- normal respiratory effort, no intercostal retractions, no accessory muscle use, and normal breath sounds.  Heart-- normal rate, regular rhythm, no murmur.  Abdomen-- Not distended, good bowel sounds,soft, non-tender. No rebound or rigidity.  Extremities-- no pretibial edema bilaterally  Neurologic--  alert & oriented X3. Speech normal, gait normal, strength normal in all extremities.  Psych-- Cognition and judgment appear intact. Cooperative with normal attention span and concentration. No anxious appearing , no depressed appearing.       Assessment & Plan:

## 2013-08-05 NOTE — Assessment & Plan Note (Signed)
Td 2009 Recommend a flu shot , benefits explained  rec to see  gyn yearly  Labs   Encouraged to continue with her healthier lifestyle

## 2013-08-05 NOTE — Assessment & Plan Note (Signed)
Pt wornders about  medication, that is an option, we discussed metformin including side effects and the fact that she cannot take it if she's pregnant. She will let me know if interested.

## 2013-08-05 NOTE — Assessment & Plan Note (Addendum)
Complains of on and off insomnia, see history of present illness. Occasionally feels  Sleepy. Epworth scale scored 8 (average). Plan: Tips for healthy sleep provided Melatonin  Monitor for OSA sx prn

## 2013-08-05 NOTE — Patient Instructions (Addendum)
Get your blood work before you leave  Next visit in  6 months    for a  follow up .  No Fasting Please make an appointment     [facility] web site for Diabetes [email or web address]  "[facility] Diabetes diet" book

## 2013-08-09 LAB — NICOTINE SCREEN, URINE: Nicotine, urine: NEGATIVE ng/mL

## 2013-08-10 ENCOUNTER — Encounter: Payer: Self-pay | Admitting: *Deleted

## 2014-07-22 ENCOUNTER — Encounter: Payer: 59 | Admitting: Internal Medicine

## 2014-07-22 ENCOUNTER — Telehealth: Payer: Self-pay | Admitting: *Deleted

## 2014-07-22 DIAGNOSIS — Z0289 Encounter for other administrative examinations: Secondary | ICD-10-CM

## 2014-07-22 NOTE — Telephone Encounter (Signed)
Pt arrived for 8am CPE appointment 07/22/2014 at 8:17, had to reschedule. Rescheduled for 07/27/2014.  Marked EOD status as NO SHOW.

## 2014-07-27 ENCOUNTER — Ambulatory Visit (INDEPENDENT_AMBULATORY_CARE_PROVIDER_SITE_OTHER): Payer: 59 | Admitting: Internal Medicine

## 2014-07-27 ENCOUNTER — Encounter: Payer: Self-pay | Admitting: Internal Medicine

## 2014-07-27 VITALS — BP 123/81 | HR 76 | Temp 98.3°F | Ht 65.0 in | Wt 201.4 lb

## 2014-07-27 DIAGNOSIS — R7309 Other abnormal glucose: Secondary | ICD-10-CM

## 2014-07-27 DIAGNOSIS — Z0000 Encounter for general adult medical examination without abnormal findings: Secondary | ICD-10-CM

## 2014-07-27 DIAGNOSIS — R7303 Prediabetes: Secondary | ICD-10-CM

## 2014-07-27 LAB — CBC WITH DIFFERENTIAL/PLATELET
BASOS ABS: 0 10*3/uL (ref 0.0–0.1)
Basophils Relative: 0.5 % (ref 0.0–3.0)
EOS PCT: 3.3 % (ref 0.0–5.0)
Eosinophils Absolute: 0.2 10*3/uL (ref 0.0–0.7)
HEMATOCRIT: 39.3 % (ref 36.0–46.0)
Hemoglobin: 12.5 g/dL (ref 12.0–15.0)
LYMPHS ABS: 2.6 10*3/uL (ref 0.7–4.0)
Lymphocytes Relative: 52 % — ABNORMAL HIGH (ref 12.0–46.0)
MCHC: 31.9 g/dL (ref 30.0–36.0)
MCV: 83.9 fl (ref 78.0–100.0)
MONOS PCT: 6.8 % (ref 3.0–12.0)
Monocytes Absolute: 0.3 10*3/uL (ref 0.1–1.0)
Neutro Abs: 1.8 10*3/uL (ref 1.4–7.7)
Neutrophils Relative %: 37.4 % — ABNORMAL LOW (ref 43.0–77.0)
Platelets: 310 10*3/uL (ref 150.0–400.0)
RBC: 4.69 Mil/uL (ref 3.87–5.11)
RDW: 13.9 % (ref 11.5–15.5)
WBC: 4.9 10*3/uL (ref 4.0–10.5)

## 2014-07-27 LAB — COMPREHENSIVE METABOLIC PANEL
ALT: 17 U/L (ref 0–35)
AST: 20 U/L (ref 0–37)
Albumin: 3.8 g/dL (ref 3.5–5.2)
Alkaline Phosphatase: 45 U/L (ref 39–117)
BILIRUBIN TOTAL: 0.6 mg/dL (ref 0.2–1.2)
BUN: 11 mg/dL (ref 6–23)
CALCIUM: 9.5 mg/dL (ref 8.4–10.5)
CHLORIDE: 106 meq/L (ref 96–112)
CO2: 19 meq/L (ref 19–32)
CREATININE: 1 mg/dL (ref 0.4–1.2)
GFR: 69.43 mL/min (ref >60.00)
Glucose, Bld: 75 mg/dL (ref 70–99)
Potassium: 4.1 mEq/L (ref 3.5–5.1)
Sodium: 137 mEq/L (ref 135–145)
Total Protein: 8 g/dL (ref 6.0–8.3)

## 2014-07-27 LAB — LIPID PANEL
CHOLESTEROL: 219 mg/dL — AB (ref 0–200)
HDL: 69.7 mg/dL (ref >39.00)
LDL Cholesterol: 135 mg/dL — ABNORMAL HIGH (ref 0–99)
NONHDL: 149.3
Total CHOL/HDL Ratio: 3
Triglycerides: 71 mg/dL (ref 0.0–149.0)
VLDL: 14.2 mg/dL (ref 0.0–40.0)

## 2014-07-27 LAB — TSH: TSH: 1.77 u[IU]/mL (ref 0.35–4.50)

## 2014-07-27 LAB — HEMOGLOBIN A1C: Hgb A1c MFr Bld: 6.1 % (ref 4.6–6.5)

## 2014-07-27 MED ORDER — IBUPROFEN 600 MG PO TABS: 600.0000 mg | ORAL_TABLET | Freq: Three times a day (TID) | ORAL | Status: DC | PRN

## 2014-07-27 NOTE — Patient Instructions (Signed)
Get your blood work before you leave    Please come back to the office in 1 year  for a physical exam. Come back fasting    

## 2014-07-27 NOTE — Progress Notes (Signed)
Subjective:    Patient ID: Rachel Goodwin, female    DOB: 06/02/1981, 33 y.o.   MRN: [id number]  DOS:  07/27/2014 Type of visit - description : CPX Interval history: doing well   ROS No  CP, SOB Denies  nausea, vomiting diarrhea, blood in the stools No abdominal pain (-) cough, sputum production (-) wheezing, chest congestion No dysuria, gross hematuria, difficulty urinating   Periods are normal No anxiety, depression    Past Medical History  Diagnosis Date  . Abortion history     x1  . Genital herpes   . Prediabetes 08/2010    A1c 6.2  09/18/2010    Past Surgical History  Procedure Laterality Date  . No past surgeries      History   Social History  . Marital Status: Single    Spouse Name: N/A    Number of Children: 0  . 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 Smoking status: Never Smoker   . Smokeless tobacco: Never Used  . Alcohol Use: Yes     Comment: socially   . Drug Use: No  . Sexual Activity: Not on file   Other Topics Concern  . Not on file   Social History Narrative   Lives by herself              Family History  Problem Relation Age of Onset  . Hypertension Mother     M ,  F, Gparents   . Hyperlipidemia Father   . Coronary artery disease Other     Gparents, uncles, age of onset?  . Stroke Maternal Grandfather   . Thyroid disease Paternal Grandmother   . Colon cancer Neg Hx   . Breast cancer Other     great aunt       Medication List       This list is accurate as of: 07/27/14  7:04 PM.  Always use your most recent med list.               cyclobenzaprine 10 MG tablet  Commonly known as:  FLEXERIL  Take 1 tablet (10 mg total) by mouth at bedtime as needed for muscle spasms.     ibuprofen 600 MG tablet  Commonly known as:  ADVIL,MOTRIN  Take 1 tablet (600 mg total) by mouth every 8 (eight) hours as needed.     VALTREX 1000 MG tablet  Generic drug:   valACYclovir  Take 1,000 mg by mouth as needed (2 BY MOUTH BID x1 DAY PRN FOR FEVER BLISTERS).           Objective:   Physical Exam BP 123/81  Pulse 76  Temp(Src) 98.3 F (36.8 C) (Oral)  Ht 5\' 5"  (1.651 m)  Wt 201 lb 6 oz (91.343 kg)  BMI 33.51 kg/m2  SpO2 99%  LMP 07/18/2014 General -- alert, well-developed, NAD.  Neck --no thyromegaly  HEENT-- Not pale.  Lungs -- normal respiratory effort, no intercostal retractions, no accessory muscle use, and normal breath sounds.  Heart-- normal rate, regular rhythm, no murmur.  Abdomen-- Not distended, good bowel sounds,soft, non-tender.  Extremities-- no pretibial edema bilaterally  Neurologic--  alert & oriented X3. Speech normal, gait appropriate for age, strength symmetric and appropriate for age.  Psych-- Cognition and judgment appear intact. Cooperative with normal attention span and concentration. No anxious or depressed appearing.     Assessment &  Plan:

## 2014-07-27 NOTE — Progress Notes (Signed)
Pre visit review using our clinic review tool, if applicable. No additional management support is needed unless otherwise documented below in the visit note. 

## 2014-07-27 NOTE — Assessment & Plan Note (Addendum)
Td 2009 Hesitant to get the  flu shot , benefits explained  Saw gyn 04-2014  Labs   Reports she is active and exercises 3 times a week, portion control and excessive carbohydrates are her main challenge regards diet. Extensive discussion about diet, calorie counting? Other issues: Occasional neck pain, takes ibuprofen, FR prescription. Very seldom takes a Flexeril. Pre-diabetes: Diet and exercise discussed, labs.

## 2014-07-29 LAB — NICOTINE/COTININE METABOLITES: Cotinine: <10 ng/mL

## 2015-07-12 ENCOUNTER — Ambulatory Visit (INDEPENDENT_AMBULATORY_CARE_PROVIDER_SITE_OTHER): Payer: BLUE CROSS/BLUE SHIELD | Admitting: Internal Medicine

## 2015-07-12 ENCOUNTER — Encounter: Payer: Self-pay | Admitting: Internal Medicine

## 2015-07-12 ENCOUNTER — Encounter (INDEPENDENT_AMBULATORY_CARE_PROVIDER_SITE_OTHER): Payer: Self-pay

## 2015-07-12 VITALS — BP 102/62 | HR 64 | Temp 97.5°F | Ht 66.0 in | Wt 196.1 lb

## 2015-07-12 DIAGNOSIS — Z09 Encounter for follow-up examination after completed treatment for conditions other than malignant neoplasm: Secondary | ICD-10-CM

## 2015-07-12 DIAGNOSIS — R7303 Prediabetes: Secondary | ICD-10-CM | POA: Diagnosis not present

## 2015-07-12 DIAGNOSIS — R21 Rash and other nonspecific skin eruption: Secondary | ICD-10-CM

## 2015-07-12 DIAGNOSIS — R51 Headache: Secondary | ICD-10-CM

## 2015-07-12 DIAGNOSIS — Z114 Encounter for screening for human immunodeficiency virus [HIV]: Secondary | ICD-10-CM

## 2015-07-12 DIAGNOSIS — Z0000 Encounter for general adult medical examination without abnormal findings: Secondary | ICD-10-CM | POA: Diagnosis not present

## 2015-07-12 LAB — HEMOGLOBIN A1C: HEMOGLOBIN A1C: 6 % (ref 4.6–6.5)

## 2015-07-12 LAB — BASIC METABOLIC PANEL
BUN: 11 mg/dL (ref 6–23)
CHLORIDE: 104 meq/L (ref 96–112)
CO2: 27 mEq/L (ref 19–32)
CREATININE: 0.92 mg/dL (ref 0.40–1.20)
Calcium: 9.8 mg/dL (ref 8.4–10.5)
GFR: 73.4 mL/min (ref >60.00)
Glucose, Bld: 93 mg/dL (ref 70–99)
POTASSIUM: 4.5 meq/L (ref 3.5–5.1)
Sodium: 135 mEq/L (ref 135–145)

## 2015-07-12 LAB — LIPID PANEL
Cholesterol: 204 mg/dL — ABNORMAL HIGH (ref 0–200)
HDL: 66.6 mg/dL (ref >39.00)
LDL CALC: 124 mg/dL — AB (ref 0–99)
NONHDL: 137.77
Total CHOL/HDL Ratio: 3
Triglycerides: 68 mg/dL (ref 0.0–149.0)
VLDL: 13.6 mg/dL (ref 0.0–40.0)

## 2015-07-12 MED ORDER — KETOCONAZOLE 2 % EX CREA: 1.0000 "application " | TOPICAL_CREAM | Freq: Every day | CUTANEOUS | Status: DC

## 2015-07-12 NOTE — Assessment & Plan Note (Signed)
URI: Already improving, will call if not completely better in few days Prediabetes: Discussed diet and exercise, calorie counting? Check A1c Rash: Likely fungal infection, prescribed Nizoral Headache, as described above: Migraine triggered by fasting? Recommend for now to avoid fasting, early use of Motrin, call the headache become intense, more frequent or different. RTC one year

## 2015-07-12 NOTE — Progress Notes (Signed)
Pre visit review using our clinic review tool, if applicable. No additional management support is needed unless otherwise documented below in the visit note. 

## 2015-07-12 NOTE — Patient Instructions (Signed)
Get your blood work before you leave   use the cream as prescribed, call if you are not improving soon.   Consider calorie counting, MYFITNESSPAL ??   Call if you have intense headache or the headaches are more frequent   Next visit  for a  physical exam in one year, fasting.   Please schedule an appointment at the front desk

## 2015-07-12 NOTE — Progress Notes (Signed)
Subjective:    Patient ID: Sena Hitchiffany S Ferrebee, female    DOB: 02/23/1981, 34 y.o.   MRN: [id number]  DOS:  07/12/2015 Type of visit - description : CPX Interval history: Has other issues besides CPX, see review of systems    Review of Systems  Constitutional: No fever. No chills. No unexplained wt changes. No unusual sweats  HEENT: No dental problems, no ear discharge, no facial swelling, no voice changes. No eye discharge, no eye  redness , no  intolerance to light   Respiratory: Developed a URI last week, on OTC, getting better. Still have some nasal congestion and cough.  Cardiovascular: No CP, no leg swelling , no  Palpitations  GI: no nausea, no vomiting, no diarrhea , no  abdominal pain.  No blood in the stools. No dysphagia, no odynophagia    Endocrine: No polyphagia, no polyuria , no polydipsia  GU: No dysuria, gross hematuria, difficulty urinating. No urinary urgency, no frequency.  Musculoskeletal: No joint swellings or unusual aches or pains. Occasional leg cramps at night  Skin: No change in the color of the skin, palor; + rash and itching at the right foot between the toes Allergic, immunologic: No environmental allergies , no  food allergies  Neurological: No dizziness no  Syncope . No diplopia, no slurred, no slurred speech, no motor deficits, no facial  Numbness. For the last year has noted occasional headache if she fasts for too long, located at the forehead, occasionally associated with nausea, denies any neck stiffness, phono or photophobia.  Hematological: No enlarged lymph nodes, no easy bruising , no unusual bleedings  Psychiatry: No suicidal ideas, no hallucinations, no beavior problems, no confusion.  No unusual/severe anxiety, no depression   Past Medical History  Diagnosis Date  . Abortion history     x1  . Genital herpes   . Prediabetes 08/2010    A1c 6.2  09/18/2010    Past Surgical History  Procedure Laterality Date  . No past surgeries       Social History   Social History  . Marital Status: Single    Spouse Name: N/A  . Number of Children: 0  . 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 Smoking status: Never Smoker   . Smokeless tobacco: Never Used  . Alcohol Use: Yes     Comment: socially   . Drug Use: No  . Sexual Activity: Not on file   Other Topics Concern  . Not on file   Social History Narrative   Lives by herself              Family History  Problem Relation Age of Onset  . Hypertension Mother     M ,  F, Gparents   . Hyperlipidemia Father   . Coronary artery disease Other     Gparents, uncles, age of onset?  . Stroke Maternal Grandfather   . Thyroid disease Paternal Grandmother   . Colon cancer Neg Hx   . Breast cancer Other     great aunt       Medication List       This list is accurate as of: 07/12/15  5:52 PM.  Always use your most recent med list.               cyclobenzaprine 10 MG tablet  Commonly known as:  FLEXERIL  Take 1 tablet (  10 mg total) by mouth at bedtime as needed for muscle spasms.     ibuprofen 600 MG tablet  Commonly known as:  ADVIL,MOTRIN  Take 1 tablet (600 mg total) by mouth every 8 (eight) hours as needed.     ketoconazole 2 % cream  Commonly known as:  NIZORAL  Apply 1 application topically daily.     ROBITUSSIN COLD & COUGH PO  Take by mouth. As directed per bottle     VALTREX 1000 MG tablet  Generic drug:  valACYclovir  Take 1,000 mg by mouth as needed (2 BY MOUTH BID x1 DAY PRN FOR FEVER BLISTERS).           Objective:   Physical Exam BP 102/62 mmHg  Pulse 64  Temp(Src) 97.5 F (36.4 C) (Oral)  Ht  (1.676 m)  Wt 196 lb 2 oz (88.962 kg)  BMI 31.67 kg/m2  SpO2 98%  LMP 06/19/2015 (Exact Date) General:   Well developed, well nourished . NAD.  Neck:  No thyromegaly. No TTP at the cervical spine HEENT:  Normocephalic . Face symmetric, atraumatic. Nose  is slightly congested, sinuses not TTP Lungs:  CTA B Normal respiratory effort, no intercostal retractions, no accessory muscle use. Heart: RRR,  no murmur.  No pretibial edema bilaterally  Abdomen:  Not distended, soft, non-tender. No rebound or rigidity. No mass,organomegaly Skin: Exposed areas without rash. Not pale. Not jaundice. Mild maceration between the second and third right toes Neurologic:  alert & oriented X3.  Speech normal, gait appropriate for age and unassisted Strength symmetric and appropriate for age. Strength symmetric.  Psych: Cognition and judgment appear intact.  Cooperative with normal attention span and concentration.  Behavior appropriate. No anxious or depressed appearing.    Assessment & Plan:    assessment> Pre- diabetes A1c 6.2 (2011) Herpes labialis  Plan: URI: Already improving, will call if not completely better in few days Prediabetes: Discussed diet and exercise, calorie counting? Check A1c Rash: Likely fungal infection, prescribed Nizoral Headache, as described above: Migraine triggered by fasting? Recommend for now to avoid fasting, early use of Motrin, call the headache become intense, more frequent or different. RTC one year

## 2015-07-12 NOTE — Assessment & Plan Note (Addendum)
Td 2009; declined a flu shot , benefits explained  Saw gyn ~ 2 weeks ago Labs    Diet and exercise discussed

## 2015-07-13 LAB — HIV ANTIBODY (ROUTINE TESTING W REFLEX): HIV 1&2 Ab, 4th Generation: NONREACTIVE

## 2015-07-18 ENCOUNTER — Encounter: Payer: Self-pay | Admitting: Internal Medicine

## 2016-07-16 ENCOUNTER — Encounter: Payer: BLUE CROSS/BLUE SHIELD | Admitting: Internal Medicine

## 2017-11-19 LAB — TSH: TSH: 1.11 (ref 0.41–5.90)

## 2017-11-19 LAB — HM PAP SMEAR

## 2017-11-19 LAB — RESULTS CONSOLE HPV: CHL HPV: NEGATIVE

## 2018-06-17 ENCOUNTER — Ambulatory Visit (INDEPENDENT_AMBULATORY_CARE_PROVIDER_SITE_OTHER): Payer: BLUE CROSS/BLUE SHIELD | Admitting: Internal Medicine

## 2018-06-17 ENCOUNTER — Encounter: Payer: Self-pay | Admitting: Internal Medicine

## 2018-06-17 VITALS — BP 126/68 | HR 68 | Temp 98.2°F | Resp 16 | Ht 66.0 in | Wt 218.0 lb

## 2018-06-17 DIAGNOSIS — Z23 Encounter for immunization: Secondary | ICD-10-CM

## 2018-06-17 DIAGNOSIS — R7303 Prediabetes: Secondary | ICD-10-CM

## 2018-06-17 DIAGNOSIS — Z0000 Encounter for general adult medical examination without abnormal findings: Secondary | ICD-10-CM | POA: Diagnosis not present

## 2018-06-17 MED ORDER — IBUPROFEN 600 MG PO TABS: 600.0000 mg | ORAL_TABLET | Freq: Three times a day (TID) | ORAL | 0 refills | Status: DC | PRN

## 2018-06-17 MED ORDER — CYCLOBENZAPRINE HCL 10 MG PO TABS: 10.0000 mg | ORAL_TABLET | Freq: Two times a day (BID) | ORAL | 0 refills | Status: DC | PRN

## 2018-06-17 MED ORDER — VALACYCLOVIR HCL 1 G PO TABS: ORAL_TABLET | ORAL | 3 refills | Status: AC

## 2018-06-17 NOTE — Assessment & Plan Note (Signed)
Td 05-2018 Female care: Per gynecology [facility]: Not indicated Diet and exercise discussed, has gained weight, information about the weight management clinic provided. Labs:CMP, FLP, CBC, A1c, TSH

## 2018-06-17 NOTE — Progress Notes (Signed)
Pre visit review using our clinic review tool, if applicable. No additional management support is needed unless otherwise documented below in the visit note. 

## 2018-06-17 NOTE — Progress Notes (Signed)
Subjective:    Patient ID: Rachel Goodwin, female    DOB: 1981-06-08, 37 y.o.   MRN: [id number]  DOS:  06/17/2018 Type of visit - description : cpx Interval history: cpx, has few concerns   Review of Systems Reports a lot of stress,   family members have medical issues and she needs to help them. Concerned about her weight gain Has occasional back and hip pain, refill Flexeril?Marland Kitchen  On ibuprofen OTC, prescribed ibuprofen? Also reports headaches, started at least 6 months ago, had an episode once a month, started gradually, usually in the forehead.  At times it has been severe, at times she feels slightly nauseous. Usually Aleve and rest makes it better.  Other than above, a 14 point review of systems is negative    Past Medical History:  Diagnosis Date  . Abortion history    x1  . Genital herpes   . Prediabetes 08/2010   A1c 6.2  09/18/2010    Past Surgical History:  Procedure Laterality Date  . NO PAST SURGERIES      Social History   Socioeconomic History  . Marital status: Single    Spouse name: Not on file  . Number of children: 0  . Years of education: Not on file  . Highest education level: Not on file  Occupational History  . Occupation: Optician, dispensing)  Social Needs  . Financial resource strain: Not on file  . Food insecurity:    Worry: Not on file    Inability: Not on file  . Transportation needs:    Medical: Not on file    Non-medical: Not on file  Tobacco Use  . Smoking status: Never Smoker  . Smokeless tobacco: Never Used  Substance and Sexual Activity  . Alcohol use: Yes    Comment: socially   . Drug use: No  . Sexual activity: Not on file  Lifestyle  . Physical activity:    Days per week: Not on file    Minutes per session: Not on file  . Stress: Not on file  Relationships  . Social connections:    Talks on phone: Not on file    Gets together: Not on file    Attends religious service: Not on file    Active member of club or  organization: Not on file    Attends meetings of clubs or organizations: Not on file    Relationship status: Not on file  . Intimate partner violence:    Fear of current or ex partner: Not on file    Emotionally abused: Not on file    Physically abused: Not on file    Forced sexual activity: Not on file  Other Topics Concern  . Not on file  Social History Narrative   Lives by herself           Family History  Problem Relation Age of Onset  . Hypertension Mother        M ,  F, Gparents   . Glaucoma Mother   . Hyperlipidemia Father   . Coronary artery disease Other        Gparents, uncles, age of onset?  . Stroke Maternal Grandfather   . Thyroid disease Paternal Grandmother   . Breast cancer Other        great aunt  . Blindness Brother        near blindness   . Colon cancer Neg Hx      Allergies as of  06/17/2018   No Known Allergies     Medication List        Accurate as of 06/17/18 11:59 PM. Always use your most recent med list.          cyclobenzaprine 10 MG tablet Commonly known as:  FLEXERIL Take 1 tablet (10 mg total) by mouth 2 (two) times daily as needed for muscle spasms.   ibuprofen 600 MG tablet Commonly known as:  ADVIL,MOTRIN Take 1 tablet (600 mg total) by mouth every 8 (eight) hours as needed for moderate pain.   valACYclovir 1000 MG tablet Commonly known as:  VALTREX Take 2 tablets by mouth twice daily for 1 day as needed for fever blisters          Objective:   Physical Exam BP 126/68 (BP Location: Left Arm, Patient Position: Sitting, Cuff Size: Small)   Pulse 68   Temp 98.2 F (36.8 C) (Oral)   Resp 16   Ht 5\' 6"  (1.676 m)   Wt 218 lb (98.9 kg)   LMP 06/12/2018 (Exact Date)   SpO2 98%   BMI 35.19 kg/m  General: Well developed, NAD, see BMI.  Neck: No  thyromegaly  HEENT:  Normocephalic . Face symmetric, atraumatic Lungs:  CTA B Normal respiratory effort, no intercostal retractions, no accessory muscle use. Heart: RRR,  no  murmur.  No pretibial edema bilaterally  Abdomen:  Not distended, soft, non-tender. No rebound or rigidity.   Skin: Exposed areas without rash. Not pale. Not jaundice Neurologic:  alert & oriented X3.  Speech normal, gait appropriate for age and unassisted Strength symmetric and appropriate for age.  EOMI, pupils equal and reactive Psych: Cognition and judgment appear intact.  Cooperative with normal attention span and concentration.  Behavior appropriate. No anxious or depressed appearing.     Assessment & Plan:   Assessment  Pre- diabetes A1c 6.2 (2011) Herpes labialis   Plan: Prediabetes: Check A1c Herpes labialis: Refill Valtrex. Headaches: As described above, no red flag symptoms, no thunderclap type of headache.  She is under a lot of stress.  Recommend a trial with ibuprofen, rest, Flexeril.  Reassess in 3 months.  If she has severe headache ("worst of  life") needs to call or go to the ER. Back pain: She reports occasional back pain, refill ibuprofen, Flexeril, call if not better for a referral. Stress: We had a long discussion about the stress management including the option of seeing a counselor, exercise, medication.  Knows to call me if something is needed from my side. RTC 3 months

## 2018-06-17 NOTE — Patient Instructions (Addendum)
GO TO THE LAB : Get the blood work     GO TO THE FRONT DESK Schedule your next appointment for a checkup in 3 months  When you have a headache: Rest Drink plenty of fluids Take Flexeril Okay to take ibuprofen If you have severe or unusual headache:: Go to the ER.

## 2018-06-18 ENCOUNTER — Encounter: Payer: Self-pay | Admitting: Internal Medicine

## 2018-06-18 LAB — COMPREHENSIVE METABOLIC PANEL
ALBUMIN: 4.3 g/dL (ref 3.5–5.2)
ALK PHOS: 47 U/L (ref 39–117)
ALT: 14 U/L (ref 0–35)
AST: 16 U/L (ref 0–37)
BILIRUBIN TOTAL: 0.3 mg/dL (ref 0.2–1.2)
BUN: 14 mg/dL (ref 6–23)
CO2: 25 mEq/L (ref 19–32)
Calcium: 9.7 mg/dL (ref 8.4–10.5)
Chloride: 104 mEq/L (ref 96–112)
Creatinine, Ser: 0.87 mg/dL (ref 0.40–1.20)
GFR: 77.05 mL/min (ref >60.00)
GLUCOSE: 89 mg/dL (ref 70–99)
Potassium: 3.7 mEq/L (ref 3.5–5.1)
Sodium: 137 mEq/L (ref 135–145)
TOTAL PROTEIN: 7.5 g/dL (ref 6.0–8.3)

## 2018-06-18 LAB — CBC WITH DIFFERENTIAL/PLATELET
Basophils Absolute: 0 10*3/uL (ref 0.0–0.1)
Basophils Relative: 0.7 % (ref 0.0–3.0)
EOS PCT: 2.8 % (ref 0.0–5.0)
Eosinophils Absolute: 0.1 10*3/uL (ref 0.0–0.7)
HCT: 38 % (ref 36.0–46.0)
Hemoglobin: 12.4 g/dL (ref 12.0–15.0)
Lymphocytes Relative: 47.8 % — ABNORMAL HIGH (ref 12.0–46.0)
Lymphs Abs: 2.5 10*3/uL (ref 0.7–4.0)
MCHC: 32.6 g/dL (ref 30.0–36.0)
MCV: 81.7 fl (ref 78.0–100.0)
MONOS PCT: 6.9 % (ref 3.0–12.0)
Monocytes Absolute: 0.4 10*3/uL (ref 0.1–1.0)
Neutro Abs: 2.2 10*3/uL (ref 1.4–7.7)
Neutrophils Relative %: 41.8 % — ABNORMAL LOW (ref 43.0–77.0)
Platelets: 366 10*3/uL (ref 150.0–400.0)
RBC: 4.65 Mil/uL (ref 3.87–5.11)
RDW: 14.5 % (ref 11.5–15.5)
WBC: 5.3 10*3/uL (ref 4.0–10.5)

## 2018-06-18 LAB — LIPID PANEL
Cholesterol: 199 mg/dL (ref 0–200)
HDL: 59.7 mg/dL (ref >39.00)
LDL Cholesterol: 115 mg/dL — ABNORMAL HIGH (ref 0–99)
NonHDL: 139.01
Total CHOL/HDL Ratio: 3
Triglycerides: 120 mg/dL (ref 0.0–149.0)
VLDL: 24 mg/dL (ref 0.0–40.0)

## 2018-06-18 LAB — HEMOGLOBIN A1C: Hgb A1c MFr Bld: 6.4 % (ref 4.6–6.5)

## 2018-06-18 LAB — TSH: TSH: 1.16 u[IU]/mL (ref 0.35–4.50)

## 2018-06-18 NOTE — Assessment & Plan Note (Signed)
Prediabetes: Check A1c Herpes labialis: Refill Valtrex. Headaches: As described above, no red flag symptoms, no thunderclap type of headache.  She is under a lot of stress.  Recommend a trial with ibuprofen, rest, Flexeril.  Reassess in 3 months.  If she has severe headache ("worst of  life") needs to call or go to the ER. Back pain: She reports occasional back pain, refill ibuprofen, Flexeril, call if not better for a referral. Stress: We had a long discussion about the stress management including the option of seeing a counselor, exercise, medication.  Knows to call me if something is needed from my side. RTC 3 months

## 2018-07-16 ENCOUNTER — Other Ambulatory Visit: Payer: Self-pay | Admitting: Internal Medicine

## 2018-09-08 ENCOUNTER — Ambulatory Visit: Payer: BLUE CROSS/BLUE SHIELD | Admitting: Internal Medicine

## 2018-09-11 ENCOUNTER — Ambulatory Visit: Payer: BLUE CROSS/BLUE SHIELD | Admitting: Internal Medicine

## 2018-09-28 ENCOUNTER — Encounter: Payer: Self-pay | Admitting: Internal Medicine

## 2018-09-28 ENCOUNTER — Ambulatory Visit: Payer: 59 | Admitting: Internal Medicine

## 2018-09-28 VITALS — BP 116/70 | HR 84 | Temp 98.1°F | Resp 16 | Ht 66.0 in | Wt 226.2 lb

## 2018-09-28 DIAGNOSIS — J069 Acute upper respiratory infection, unspecified: Secondary | ICD-10-CM

## 2018-09-28 DIAGNOSIS — R7303 Prediabetes: Secondary | ICD-10-CM

## 2018-09-28 DIAGNOSIS — F439 Reaction to severe stress, unspecified: Secondary | ICD-10-CM

## 2018-09-28 NOTE — Progress Notes (Signed)
Subjective:    Patient ID: Rachel Goodwin, female    DOB: 08/11/1981, 37 y.o.   MRN: [id number]  DOS:  09/28/2018 Type of visit - description: f/u We discussed several issues: Continue with stress but  is managing better Headaches: After last OV,  had a single episode of moderate headache and she has been asx for the last 2 months. We talk about her A1c Has gained weight, she asked about best way to diet. She also developed runny nose and postnasal dripping yesterday. Denies fever, chills, mild cough which is dry.  Mild sore throat.  Wt Readings from Last 3 Encounters:  09/28/18 226 lb 4 oz (102.6 kg)  06/17/18 218 lb (98.9 kg)  07/12/15 196 lb 2 oz (89 kg)     Review of Systems See above  Past Medical History:  Diagnosis Date  . Abortion history    x1  . Genital herpes   . Prediabetes 08/2010   A1c 6.2  09/18/2010    Past Surgical History:  Procedure Laterality Date  . NO PAST SURGERIES      Social History   Socioeconomic History  . Marital status: Single    Spouse name: Not on file  . Number of children: 0  . Years of education: Not on file  . Highest education level: Not on file  Occupational History  . 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 Financial resource strain: Not on file  . Food insecurity:    Worry: Not on file    Inability: Not on file  . Transportation needs:    Medical: Not on file    Non-medical: Not on file  Tobacco Use  . Smoking status: Never Smoker  . Smokeless tobacco: Never Used  Substance and Sexual Activity  . Alcohol use: Yes    Comment: socially   . Drug use: No  . Sexual activity: Not on file  Lifestyle  . Physical activity:    Days per week: Not on file    Minutes per session: Not on file  . Stress: Not on file  Relationships  . Social connections:    Talks on phone: Not on file    Gets together: Not on file    Attends religious service: Not on file    Active member of club or organization: Not  on file    Attends meetings of clubs or organizations: Not on file    Relationship status: Not on file  . Intimate partner violence:    Fear of current or ex partner: Not on file    Emotionally abused: Not on file    Physically abused: Not on file    Forced sexual activity: Not on file  Other Topics Concern  . Not on file  Social History Narrative   Lives by herself            Allergies as of 09/28/2018   No Known Allergies     Medication List       Accurate as of September 28, 2018  8:33 AM. Always use your most recent med list.        cyclobenzaprine 10 MG tablet Commonly known as:  FLEXERIL Take 1 tablet (10 mg total) by mouth 2 (two) times daily as needed for muscle spasms.   ibuprofen 600 MG tablet Commonly known as:  ADVIL,MOTRIN Take 1 tablet (600 mg total) by mouth every 8 (eight) hours as needed for moderate pain.   valACYclovir 1000 MG  tablet Commonly known as:  VALTREX Take 2 tablets by mouth twice daily for 1 day as needed for fever blisters           Objective:   Physical Exam BP 116/70 (BP Location: Left Arm, Patient Position: Sitting, Cuff Size: Normal)   Pulse 84   Temp 98.1 F (36.7 C) (Oral)   Resp 16   Ht 5\' 6"  (1.676 m)   Wt 226 lb 4 oz (102.6 kg)   SpO2 98%   BMI 36.52 kg/m  General:   Well developed, NAD, BMI noted. HEENT:  Normocephalic . Face symmetric, atraumatic.  TM on the right normal, on the left slightly bulge.  Nose is slightly congested, sinuses no TTP.  Throat symmetric Lungs:  CTA B Normal respiratory effort, no intercostal retractions, no accessory muscle use. Heart: RRR,  no murmur.  No pretibial edema bilaterally  Skin: Not pale. Not jaundice Neurologic:  alert & oriented X3.  Speech normal, gait appropriate for age and unassisted Psych--  Cognition and judgment appear intact.  Cooperative with normal attention span and concentration.  Behavior appropriate. No anxious or depressed appearing.        Assessment     Assessment  Pre- diabetes A1c 6.2 (2011) Herpes labialis   Plan: Prediabetes, last A1c 6.4, we talked about diet, exercise, benefit of decreasing carbohydrate intakes.  Again provided information regards the wellness clinic. Headaches: See last office visit, resolved Stress: Managing her stress better.  Currently doing well. URI: Recommend Tylenol, Robitussin, rest.  Call if not better.  She has a leftover Singulair which seems to be helping and that is okay. RTC 06/2019 CPX

## 2018-09-28 NOTE — Patient Instructions (Signed)
  GO TO THE FRONT DESK Schedule your next appointment for a  Physical exam by 06-2019

## 2018-09-28 NOTE — Progress Notes (Signed)
Pre visit review using our clinic review tool, if applicable. No additional management support is needed unless otherwise documented below in the visit note. 

## 2018-09-28 NOTE — Assessment & Plan Note (Signed)
Prediabetes, last A1c 6.4, we talked about diet, exercise, benefit of decreasing carbohydrate intakes.  Again provided information regards the wellness clinic. Headaches: See last office visit, resolved Stress: Managing her stress better.  Currently doing well. URI: Recommend Tylenol, Robitussin, rest.  Call if not better.  She has a leftover Singulair which seems to be helping and that is okay. RTC 06/2019 CPX

## 2019-07-08 ENCOUNTER — Other Ambulatory Visit: Payer: Self-pay

## 2019-07-08 ENCOUNTER — Ambulatory Visit (INDEPENDENT_AMBULATORY_CARE_PROVIDER_SITE_OTHER): Payer: 59 | Admitting: Internal Medicine

## 2019-07-08 ENCOUNTER — Encounter: Payer: Self-pay | Admitting: Internal Medicine

## 2019-07-08 VITALS — BP 127/51 | HR 70 | Temp 96.9°F | Resp 16 | Ht 66.0 in | Wt 215.0 lb

## 2019-07-08 DIAGNOSIS — Z0000 Encounter for general adult medical examination without abnormal findings: Secondary | ICD-10-CM

## 2019-07-08 DIAGNOSIS — R7303 Prediabetes: Secondary | ICD-10-CM

## 2019-07-08 LAB — CBC WITH DIFFERENTIAL/PLATELET
Basophils Absolute: 0 10*3/uL (ref 0.0–0.1)
Basophils Relative: 0.7 % (ref 0.0–3.0)
Eosinophils Absolute: 0.1 10*3/uL (ref 0.0–0.7)
Eosinophils Relative: 3 % (ref 0.0–5.0)
HCT: 39.1 % (ref 36.0–46.0)
Hemoglobin: 12.5 g/dL (ref 12.0–15.0)
Lymphocytes Relative: 39.1 % (ref 12.0–46.0)
Lymphs Abs: 1.9 10*3/uL (ref 0.7–4.0)
MCHC: 32.1 g/dL (ref 30.0–36.0)
MCV: 82.1 fl (ref 78.0–100.0)
Monocytes Absolute: 0.4 10*3/uL (ref 0.1–1.0)
Monocytes Relative: 8.9 % (ref 3.0–12.0)
Neutro Abs: 2.3 10*3/uL (ref 1.4–7.7)
Neutrophils Relative %: 48.3 % (ref 43.0–77.0)
Platelets: 322 10*3/uL (ref 150.0–400.0)
RBC: 4.76 Mil/uL (ref 3.87–5.11)
RDW: 15.2 % (ref 11.5–15.5)
WBC: 4.8 10*3/uL (ref 4.0–10.5)

## 2019-07-08 LAB — LIPID PANEL
Cholesterol: 186 mg/dL (ref 0–200)
HDL: 62.3 mg/dL (ref >39.00)
LDL Cholesterol: 111 mg/dL — ABNORMAL HIGH (ref 0–99)
NonHDL: 124
Total CHOL/HDL Ratio: 3
Triglycerides: 67 mg/dL (ref 0.0–149.0)
VLDL: 13.4 mg/dL (ref 0.0–40.0)

## 2019-07-08 LAB — COMPREHENSIVE METABOLIC PANEL
ALT: 15 U/L (ref 0–35)
AST: 14 U/L (ref 0–37)
Albumin: 3.9 g/dL (ref 3.5–5.2)
Alkaline Phosphatase: 43 U/L (ref 39–117)
BUN: 13 mg/dL (ref 6–23)
CO2: 25 mEq/L (ref 19–32)
Calcium: 9.1 mg/dL (ref 8.4–10.5)
Chloride: 106 mEq/L (ref 96–112)
Creatinine, Ser: 0.92 mg/dL (ref 0.40–1.20)
GFR: 67.6 mL/min (ref >60.00)
Glucose, Bld: 92 mg/dL (ref 70–99)
Potassium: 4.4 mEq/L (ref 3.5–5.1)
Sodium: 136 mEq/L (ref 135–145)
Total Bilirubin: 0.3 mg/dL (ref 0.2–1.2)
Total Protein: 6.9 g/dL (ref 6.0–8.3)

## 2019-07-08 LAB — HEMOGLOBIN A1C: Hgb A1c MFr Bld: 6.5 % (ref 4.6–6.5)

## 2019-07-08 NOTE — Assessment & Plan Note (Addendum)
-  Td 05-2018 -Flu shot will be at work - Female care: Per gynecology - CCS: Not indicated -Diet and exercise: She was able to lose 20 pounds by eating healthier and increase her physical activity, has gained some weight back.  Encouraged to continue being active and eating healthy - Labs: CMP, FLP, CBC, A1c

## 2019-07-08 NOTE — Progress Notes (Signed)
Pre visit review using our clinic review tool, if applicable. No additional management support is needed unless otherwise documented below in the visit note. 

## 2019-07-08 NOTE — Patient Instructions (Signed)
GO TO THE LAB : Get the blood work     GO TO THE FRONT DESK Schedule your next appointment   for a physical exam in 1 year 

## 2019-07-08 NOTE — Progress Notes (Signed)
Subjective:    Patient ID: Rachel Goodwin, female    DOB: 1981-08-26, 38 y.o.   MRN: [id number]  DOS:  07/08/2019 Type of visit - description: CPX No major concerns    Review of Systems Having some problems with plantar fasciitis  Other than above, a 14 point review of systems is negative   Past Medical History:  Diagnosis Date  . Abortion history    x1  . Genital herpes   . Prediabetes 08/2010   A1c 6.2  09/18/2010    Past Surgical History:  Procedure Laterality Date  . NO PAST SURGERIES      Social History   Socioeconomic History  . Marital status: Single    Spouse name: Not on file  . Number of children: 0  . Years of education: Not on file  . Highest education level: Not on file  Occupational History  . Occupation: Armed forces operational officer Psychologist, counselling)  Social Needs  . Financial resource strain: Not on file  . Food insecurity    Worry: Not on file    Inability: Not on file  . Transportation needs    Medical: Not on file    Non-medical: Not on file  Tobacco Use  . Smoking status: Never Smoker  . Smokeless tobacco: Never Used  Substance and Sexual Activity  . Alcohol use: Yes    Comment: socially   . Drug use: No  . Sexual activity: Not on file  Lifestyle  . Physical activity    Days per week: Not on file    Minutes per session: Not on file  . Stress: Not on file  Relationships  . Social Musician on phone: Not on file    Gets together: Not on file    Attends religious service: Not on file    Active member of club or organization: Not on file    Attends meetings of clubs or organizations: Not on file    Relationship status: Not on file  . Intimate partner violence    Fear of current or ex partner: Not on file    Emotionally abused: Not on file    Physically abused: Not on file    Forced sexual activity: Not on file  Other Topics Concern  . Not on file  Social History Narrative   Lives by herself             Family History   Problem Relation Age of Onset  . Hypertension Mother        M ,  F, Gparents   . Glaucoma Mother   . Hyperlipidemia Father   . Coronary artery disease Other        Gparents, uncles, age of onset?  . Stroke Maternal Grandfather   . Thyroid disease Paternal Grandmother   . Breast cancer Other        great aunt  . Blindness Brother        near blindness   . Colon cancer Neg Hx     Allergies as of 07/08/2019   No Known Allergies     Medication List       Accurate as of July 08, 2019 11:59 PM. If you have any questions, ask your nurse or doctor.        ibuprofen 600 MG tablet Commonly known as: ADVIL Take 600 mg by mouth every 6 (six) hours as needed.   valACYclovir 1000 MG tablet Commonly known as: Valtrex Take 2  tablets by mouth twice daily for 1 day as needed for fever blisters           Objective:   Physical Exam BP (!) 127/51 (BP Location: Left Arm, Patient Position: Sitting, Cuff Size: Normal)   Pulse 70   Temp (!) 96.9 F (36.1 C) (Temporal)   Resp 16   Ht 5\' 6"  (1.676 m)   Wt 215 lb (97.5 kg)   LMP 06/14/2019 (Exact Date)   SpO2 100%   BMI 34.70 kg/m  General: Well developed, NAD, BMI noted Neck: No  thyromegaly  HEENT:  Normocephalic . Face symmetric, atraumatic Lungs:  CTA B Normal respiratory effort, no intercostal retractions, no accessory muscle use. Heart: RRR,  no murmur.  No pretibial edema bilaterally  Abdomen:  Not distended, soft, non-tender. No rebound or rigidity.   Skin: Exposed areas without rash. Not pale. Not jaundice Neurologic:  alert & oriented X3.  Speech normal, gait appropriate for age and unassisted Strength symmetric and appropriate for age.  Psych: Cognition and judgment appear intact.  Cooperative with normal attention span and concentration.  Behavior appropriate. No anxious or depressed appearing.     Assessment      Assessment  Pre- diabetes A1c 6.2 (2011) Herpes labialis   PLAN: Prediabetes: Check  A1c, discussed diet, exercise. Herpes labialis: No recent problems. Plantar fasciitis: Discussed stretching and appropriate shoe inserts RTC 1 year

## 2019-07-09 ENCOUNTER — Other Ambulatory Visit: Payer: Self-pay | Admitting: *Deleted

## 2019-07-09 DIAGNOSIS — Z20822 Contact with and (suspected) exposure to covid-19: Secondary | ICD-10-CM

## 2019-07-09 NOTE — Assessment & Plan Note (Signed)
Prediabetes: Check A1c, discussed diet, exercise. Herpes labialis: No recent problems. Plantar fasciitis: Discussed stretching and appropriate shoe inserts RTC 1 year

## 2019-07-11 LAB — NOVEL CORONAVIRUS, NAA: SARS-CoV-2, NAA: NOT DETECTED

## 2019-07-15 ENCOUNTER — Other Ambulatory Visit: Payer: Self-pay

## 2019-07-15 DIAGNOSIS — Z20822 Contact with and (suspected) exposure to covid-19: Secondary | ICD-10-CM

## 2019-07-16 LAB — NOVEL CORONAVIRUS, NAA: SARS-CoV-2, NAA: NOT DETECTED

## 2019-09-22 ENCOUNTER — Ambulatory Visit: Payer: 59 | Attending: Internal Medicine

## 2019-09-22 ENCOUNTER — Other Ambulatory Visit: Payer: Self-pay

## 2019-09-22 DIAGNOSIS — Z20822 Contact with and (suspected) exposure to covid-19: Secondary | ICD-10-CM

## 2019-09-23 ENCOUNTER — Ambulatory Visit: Payer: 59 | Attending: Internal Medicine

## 2019-09-23 DIAGNOSIS — Z20822 Contact with and (suspected) exposure to covid-19: Secondary | ICD-10-CM

## 2019-09-23 LAB — NOVEL CORONAVIRUS, NAA: SARS-CoV-2, NAA: NOT DETECTED

## 2019-09-24 LAB — NOVEL CORONAVIRUS, NAA: SARS-CoV-2, NAA: NOT DETECTED

## 2019-11-22 ENCOUNTER — Other Ambulatory Visit (HOSPITAL_BASED_OUTPATIENT_CLINIC_OR_DEPARTMENT_OTHER): Payer: Self-pay | Admitting: Internal Medicine

## 2019-11-22 DIAGNOSIS — Z1231 Encounter for screening mammogram for malignant neoplasm of breast: Secondary | ICD-10-CM

## 2019-11-23 ENCOUNTER — Telehealth: Payer: Self-pay

## 2019-11-23 MED ORDER — IBUPROFEN 600 MG PO TABS: 600.0000 mg | ORAL_TABLET | Freq: Four times a day (QID) | ORAL | 0 refills | Status: DC | PRN

## 2019-11-23 NOTE — Telephone Encounter (Signed)
Pt uses PRN for neck/back pain- last filled 2019. Rx sent.

## 2019-11-23 NOTE — Telephone Encounter (Signed)
Patient called in to see if Dr Drue Novel could send in a prescription in for  ibuprofen (ADVIL) 600 MG tablet [[id number]]    Thanks,

## 2019-11-25 ENCOUNTER — Other Ambulatory Visit: Payer: Self-pay | Admitting: Internal Medicine

## 2019-12-16 ENCOUNTER — Ambulatory Visit (HOSPITAL_BASED_OUTPATIENT_CLINIC_OR_DEPARTMENT_OTHER)
Admission: RE | Admit: 2019-12-16 | Discharge: 2019-12-16 | Disposition: A | Discharge status: Home / Self Care | Payer: PRIVATE HEALTH INSURANCE | Source: Ambulatory Visit | Attending: Internal Medicine | Admitting: Internal Medicine

## 2019-12-16 ENCOUNTER — Other Ambulatory Visit: Payer: Self-pay

## 2019-12-16 DIAGNOSIS — Z1231 Encounter for screening mammogram for malignant neoplasm of breast: Secondary | ICD-10-CM

## 2019-12-20 ENCOUNTER — Other Ambulatory Visit: Payer: Self-pay | Admitting: Internal Medicine

## 2019-12-20 DIAGNOSIS — N6310 Unspecified lump in the right breast, unspecified quadrant: Secondary | ICD-10-CM

## 2020-01-05 ENCOUNTER — Other Ambulatory Visit: Payer: Self-pay

## 2020-01-05 ENCOUNTER — Ambulatory Visit
Admission: RE | Admit: 2020-01-05 | Discharge: 2020-01-05 | Disposition: A | Discharge status: Home / Self Care | Payer: PRIVATE HEALTH INSURANCE | Source: Ambulatory Visit | Attending: Internal Medicine | Admitting: Internal Medicine

## 2020-01-05 DIAGNOSIS — N6310 Unspecified lump in the right breast, unspecified quadrant: Secondary | ICD-10-CM

## 2020-05-03 LAB — HM DIABETES EYE EXAM

## 2020-05-08 ENCOUNTER — Encounter: Payer: Self-pay | Admitting: Internal Medicine

## 2020-05-08 ENCOUNTER — Other Ambulatory Visit: Payer: Self-pay

## 2020-05-08 ENCOUNTER — Ambulatory Visit: Payer: PRIVATE HEALTH INSURANCE | Admitting: Internal Medicine

## 2020-05-08 VITALS — BP 145/87 | HR 68 | Temp 98.4°F | Resp 12 | Ht 66.0 in | Wt 225.6 lb

## 2020-05-08 DIAGNOSIS — R030 Elevated blood-pressure reading, without diagnosis of hypertension: Secondary | ICD-10-CM | POA: Diagnosis not present

## 2020-05-08 NOTE — Assessment & Plan Note (Signed)
Elevated BP: BP was elevated at the eye doctor office, SBP 148. Home BP readings: 131/93, 134/97, 141/93.  Minimal increase in diastolic BP. She is asymptomatic although she feels somewhat fatigued. EKG today: NSR Plan: Low-salt diet, stress management (counseling?  Yoga, meditation?),  Take a daily walk understanding that time is very limited. Continue monitoring 3 times a week. See AVS Reassess on RTC ( October 2021)

## 2020-05-08 NOTE — Patient Instructions (Addendum)
Continue checking your blood pressure 3-4 times a week BP GOAL is between 110/65 and  135/85. If it is consistently higher or lower, let me know  Watch her salt intake  Take a daily walk  Try to manage her stress   See you in October for your physical exam   DASH Eating Plan DASH stands for "Dietary Approaches to Stop Hypertension." The DASH eating plan is a healthy eating plan that has been shown to reduce high blood pressure (hypertension). It may also reduce your risk for type 2 diabetes, heart disease, and stroke. The DASH eating plan may also help with weight loss. What are tips for following this plan?  General guidelines  Avoid eating more than 2,300 mg (milligrams) of salt (sodium) a day. If you have hypertension, you may need to reduce your sodium intake to 1,500 mg a day.  Limit alcohol intake to no more than 1 drink a day for nonpregnant women and 2 drinks a day for men. One drink equals 12 oz of beer, 5 oz of wine, or 1 oz of hard liquor.  Work with your health care provider to maintain a healthy body weight or to lose weight. Ask what an ideal weight is for you.  Get at least 30 minutes of exercise that causes your heart to beat faster (aerobic exercise) most days of the week. Activities may include walking, swimming, or biking.  Work with your health care provider or diet and nutrition specialist (dietitian) to adjust your eating plan to your individual calorie needs. Reading food labels   Check food labels for the amount of sodium per serving. Choose foods with less than 5 percent of the Daily Value of sodium. Generally, foods with less than 300 mg of sodium per serving fit into this eating plan.  To find whole grains, look for the word "whole" as the first word in the ingredient list. Shopping  Buy products labeled as "low-sodium" or "no salt added."  Buy fresh foods. Avoid canned foods and premade or frozen meals. Cooking  Avoid adding salt when cooking.  Use salt-free seasonings or herbs instead of table salt or sea salt. Check with your health care provider or pharmacist before using salt substitutes.  Do not fry foods. Cook foods using healthy methods such as baking, boiling, grilling, and broiling instead.  Cook with heart-healthy oils, such as olive, canola, soybean, or sunflower oil. Meal planning  Eat a balanced diet that includes: ? 5 or more servings of fruits and vegetables each day. At each meal, try to fill half of your plate with fruits and vegetables. ? Up to 6-8 servings of whole grains each day. ? Less than 6 oz of lean meat, poultry, or fish each day. A 3-oz serving of meat is about the same size as a deck of cards. One egg equals 1 oz. ? 2 servings of low-fat dairy each day. ? A serving of nuts, seeds, or beans 5 times each week. ? Heart-healthy fats. Healthy fats called Omega-3 fatty acids are found in foods such as flaxseeds and coldwater fish, like sardines, salmon, and mackerel.  Limit how much you eat of the following: ? Canned or prepackaged foods. ? Food that is high in trans fat, such as fried foods. ? Food that is high in saturated fat, such as fatty meat. ? Sweets, desserts, sugary drinks, and other foods with added sugar. ? Full-fat dairy products.  Do not salt foods before eating.  Try to eat at least  2 vegetarian meals each week.  Eat more home-cooked food and less restaurant, buffet, and fast food.  When eating at a restaurant, ask that your food be prepared with less salt or no salt, if possible. What foods are recommended? The items listed may not be a complete list. Talk with your dietitian about what dietary choices are best for you. Grains Whole-grain or whole-wheat bread. Whole-grain or whole-wheat pasta. Brown rice. [place]. Bulgur. Whole-grain and low-sodium cereals. Pita bread. Low-fat, low-sodium crackers. Whole-wheat flour tortillas. Vegetables Fresh or frozen vegetables (raw,  steamed, roasted, or grilled). Low-sodium or reduced-sodium tomato and vegetable juice. Low-sodium or reduced-sodium tomato sauce and tomato paste. Low-sodium or reduced-sodium canned vegetables. Fruits All fresh, dried, or frozen fruit. Canned fruit in natural juice (without added sugar). Meat and other protein foods Skinless chicken or [place]. Ground chicken or [place]. Pork with fat trimmed off. Fish and seafood. Egg whites. Dried beans, peas, or lentils. Unsalted nuts, nut butters, and seeds. Unsalted canned beans. Lean cuts of beef with fat trimmed off. Low-sodium, lean deli meat. Dairy Low-fat (1%) or fat-free (skim) milk. Fat-free, low-fat, or reduced-fat cheeses. Nonfat, low-sodium ricotta or cottage cheese. Low-fat or nonfat yogurt. Low-fat, low-sodium cheese. Fats and oils Soft margarine without trans fats. Vegetable oil. Low-fat, reduced-fat, or light mayonnaise and salad dressings (reduced-sodium). Canola, safflower, olive, soybean, and sunflower oils. Avocado. Seasoning and other foods Herbs. Spices. Seasoning mixes without salt. Unsalted popcorn and pretzels. Fat-free sweets. What foods are not recommended? The items listed may not be a complete list. Talk with your dietitian about what dietary choices are best for you. Grains Baked goods made with fat, such as croissants, muffins, or some breads. Dry pasta or rice meal packs. Vegetables Creamed or fried vegetables. Vegetables in a cheese sauce. Regular canned vegetables (not low-sodium or reduced-sodium). Regular canned tomato sauce and paste (not low-sodium or reduced-sodium). Regular tomato and vegetable juice (not low-sodium or reduced-sodium). Angie Fava. Olives. Fruits Canned fruit in a light or heavy syrup. Fried fruit. Fruit in cream or butter sauce. Meat and other protein foods Fatty cuts of meat. Ribs. Fried meat. Berniece Salines. Sausage. Bologna and other processed lunch meats. Salami. Fatback. Hotdogs. Bratwurst. Salted nuts and  seeds. Canned beans with added salt. Canned or smoked fish. Whole eggs or egg yolks. Chicken or [place] with skin. Dairy Whole or 2% milk, cream, and half-and-half. Whole or full-fat cream cheese. Whole-fat or sweetened yogurt. Full-fat cheese. Nondairy creamers. Whipped toppings. Processed cheese and cheese spreads. Fats and oils Butter. Stick margarine. Lard. Shortening. Ghee. Bacon fat. Tropical oils, such as coconut, palm kernel, or palm oil. Seasoning and other foods Salted popcorn and pretzels. Onion salt, garlic salt, seasoned salt, table salt, and sea salt. Worcestershire sauce. Tartar sauce. Barbecue sauce. Teriyaki sauce. Soy sauce, including reduced-sodium. Steak sauce. Canned and packaged gravies. Fish sauce. Oyster sauce. Cocktail sauce. Horseradish that you find on the shelf. Ketchup. Mustard. Meat flavorings and tenderizers. Bouillon cubes. Hot sauce and Tabasco sauce. Premade or packaged marinades. Premade or packaged taco seasonings. Relishes. Regular salad dressings. Where to find more information:  National Heart, Lung, and [place]: https://wilson-eaton.com/  American Heart Association: www.heart.org Summary  The DASH eating plan is a healthy eating plan that has been shown to reduce high blood pressure (hypertension). It may also reduce your risk for type 2 diabetes, heart disease, and stroke.  With the DASH eating plan, you should limit salt (sodium) intake to 2,300 mg a day. If you have hypertension, you may  need to reduce your sodium intake to 1,500 mg a day.  When on the DASH eating plan, aim to eat more fresh fruits and vegetables, whole grains, lean proteins, low-fat dairy, and heart-healthy fats.  Work with your health care provider or diet and nutrition specialist (dietitian) to adjust your eating plan to your individual calorie needs. This information is not intended to replace advice given to you by your health care provider. Make sure you discuss any questions you  have with your health care provider. Document Revised: 08/29/2017 Document Reviewed: 09/09/2016 Elsevier Patient Education  2020 [employer].

## 2020-05-08 NOTE — Progress Notes (Signed)
   Subjective:    Patient ID: Rachel Goodwin, female    DOB: 1981/06/14, 39 y.o.   MRN: [id number]  DOS:  05/08/2020 Type of visit - description: Acute Went to see her eye doctor last week, BP was noted to be elevated. Since then she is monitoring at home and it is also mildly elevated. She takes ibuprofen at most once a week.  BP Readings from Last 3 Encounters:  05/08/20 (!) 145/87  07/08/19 (!) 127/51  09/28/18 116/70   Wt Readings from Last 3 Encounters:  05/08/20 225 lb 9.6 oz (102.3 kg)  07/08/19 215 lb (97.5 kg)  09/28/18 226 lb 4 oz (102.6 kg)     Review of Systems    Denies chest pain no difficulty breathing. No edema Admits to stress, has a full-time job and still going to school. Has gained some weight. Denies headache or dizziness.  Past Medical History:  Diagnosis Date  . Abortion history    x1  . Genital herpes   . Prediabetes 08/2010   A1c 6.2  09/18/2010    Past Surgical History:  Procedure Laterality Date  . NO PAST SURGERIES      Allergies as of 05/08/2020   No Known Allergies     Medication List       Accurate as of May 08, 2020 11:43 AM. If you have any questions, ask your nurse or doctor.        ibuprofen 600 MG tablet Commonly known as: ADVIL Take 1 tablet (600 mg total) by mouth every 6 (six) hours as needed for moderate pain.   valACYclovir 1000 MG tablet Commonly known as: Valtrex Take 2 tablets by mouth twice daily for 1 day as needed for fever blisters          Objective:   Physical Exam BP (!) 145/87 (BP Location: Right Arm, Cuff Size: Large)   Pulse 68   Temp 98.4 F (36.9 C) (Oral)   Resp 12   Ht 5\' 6"  (1.676 m)   Wt 225 lb 9.6 oz (102.3 kg)   LMP 05/03/2020   SpO2 100%   BMI 36.41 kg/m  General:   Well developed, NAD, BMI noted. HEENT:  Normocephalic . Face symmetric, atraumatic Lungs:  CTA B Normal respiratory effort, no intercostal retractions, no accessory muscle use. Heart: RRR,  no murmur.  Lower  extremities: no pretibial edema bilaterally  Skin: Not pale. Not jaundice Neurologic:  alert & oriented X3.  Speech normal, gait appropriate for age and unassisted Psych--  Cognition and judgment appear intact.  Cooperative with normal attention span and concentration.  Behavior appropriate. No anxious or depressed appearing.      Assessment     Assessment  Pre- diabetes A1c 6.2 (2011) Herpes labialis   PLAN: Elevated BP: BP was elevated at the eye doctor office, SBP 148. Home BP readings: 131/93, 134/97, 141/93.  Minimal increase in diastolic BP. She is asymptomatic although she feels somewhat fatigued. EKG today: NSR Plan: Low-salt diet, stress management (counseling?  Yoga, meditation?),  Take a daily walk understanding that time is very limited. Continue monitoring 3 times a week. See AVS Reassess on RTC ( October 2021)   This visit occurred during the SARS-CoV-2 public health emergency.  Safety protocols were in place, including screening questions prior to the visit, additional usage of staff PPE, and extensive cleaning of exam room while observing appropriate contact time as indicated for disinfecting solutions.

## 2020-07-12 ENCOUNTER — Encounter: Payer: 59 | Admitting: Internal Medicine

## 2020-08-08 ENCOUNTER — Encounter: Payer: Self-pay | Admitting: Internal Medicine

## 2020-08-08 ENCOUNTER — Ambulatory Visit (INDEPENDENT_AMBULATORY_CARE_PROVIDER_SITE_OTHER): Payer: 59 | Admitting: Internal Medicine

## 2020-08-08 ENCOUNTER — Other Ambulatory Visit: Payer: Self-pay

## 2020-08-08 VITALS — BP 136/90 | HR 82 | Temp 98.0°F | Resp 16 | Ht 65.5 in | Wt 235.0 lb

## 2020-08-08 DIAGNOSIS — G441 Vascular headache, not elsewhere classified: Secondary | ICD-10-CM

## 2020-08-08 DIAGNOSIS — R0683 Snoring: Secondary | ICD-10-CM

## 2020-08-08 DIAGNOSIS — Z0000 Encounter for general adult medical examination without abnormal findings: Secondary | ICD-10-CM | POA: Diagnosis not present

## 2020-08-08 DIAGNOSIS — R030 Elevated blood-pressure reading, without diagnosis of hypertension: Secondary | ICD-10-CM | POA: Diagnosis not present

## 2020-08-08 DIAGNOSIS — E119 Type 2 diabetes mellitus without complications: Secondary | ICD-10-CM

## 2020-08-08 NOTE — Patient Instructions (Addendum)
Per our records you are due for an eye exam. Please contact your eye doctor to schedule an appointment. Please have them send copies of your office visit notes to [place]. Our fax number is [phone].    DIABETES self learn tools:  Consider getting one of the following books: "Program for Reversing Diabetes" (solid advise but may not include the latest medicines available) "Diabetes for Dummies" "The [facility] Diabetes diet"    "The Essential Diabetes Book"  Online resources:  The American diabetes Association     diabetes.org  Visit [facility] website it is a Chief Technology Officer  joslin.org  The [facility] web site has a diabetes section  PinkCheek.be       Check the  blood pressure 2 or 3 times a week BP GOAL is between 110/65 and  135/85. If it is consistently higher or lower, let me know  Consider weight watchers, consider wellness clinic  Avoid prolonged fasting, avoid excessive caffeine.  Call if you have another severe headache.  GO TO THE LAB : Get the blood work     GO TO THE FRONT DESK, PLEASE SCHEDULE YOUR APPOINTMENTS Come back for recheck in 4 weeks  We will schedule a brain MRI

## 2020-08-08 NOTE — Progress Notes (Signed)
Pre visit review using our clinic review tool, if applicable. No additional management support is needed unless otherwise documented below in the visit note. 

## 2020-08-08 NOTE — Progress Notes (Signed)
Subjective:    Patient ID: Rachel Goodwin, female    DOB: 1981/08/08, 39 y.o.   MRN: [id number]  DOS:  08/08/2020 Type of visit - description: CPX  Here for CPX, he has some complaints.  Continue to gain weight No recent ambulatory BPs She has a history of on and off headaches for a while, they have been more intense lately, timeframe of increased headaches unclear. ~ 4 to 5 weeks ago have a HA that was very intense, located at the frontal area, associated with nausea.  That was probably the worst headache she ever had. No other episodes as bad as that one. They typically happen during the weekend and she wonders if they are related to not having coffee early in the morning or having a delayed breakfast (is entirely possible she is right)  She admits to snoring. Denies stress, feeling well emotionally.  Wt Readings from Last 3 Encounters:  08/08/20 235 lb (106.6 kg)  05/08/20 225 lb 9.6 oz (102.3 kg)  07/08/19 215 lb (97.5 kg)   BP Readings from Last 3 Encounters:  08/08/20 136/90  05/08/20 (!) 145/87  07/08/19 (!) 127/51     Review of Systems  Other than above, a 14 point review of systems is negative     Past Medical History:  Diagnosis Date  . Abortion history    x1  . Genital herpes   . Prediabetes 08/2010   A1c 6.2  09/18/2010    Past Surgical History:  Procedure Laterality Date  . NO PAST SURGERIES      Allergies as of 08/08/2020   No Known Allergies     Medication List       Accurate as of August 08, 2020 11:59 PM. If you have any questions, ask your nurse or doctor.        STOP taking these medications   ibuprofen 600 MG tablet Commonly known as: ADVIL Stopped by: Willow Ora, MD     TAKE these medications   valACYclovir 1000 MG tablet Commonly known as: Valtrex Take 2 tablets by mouth twice daily for 1 day as needed for fever blisters          Objective:   Physical Exam BP 136/90 (BP Location: Left Arm, Patient Position: Sitting,  Cuff Size: Normal)   Pulse 82   Temp 98 F (36.7 C) (Oral)   Resp 16   Ht 5' 5.5" (1.664 m)   Wt 235 lb (106.6 kg)   LMP 07/26/2020 (Approximate)   SpO2 98%   BMI 38.51 kg/m  General: Well developed, NAD, BMI noted Neck: No  thyromegaly  HEENT:  Normocephalic . Face symmetric, atraumatic Lungs:  CTA B Normal respiratory effort, no intercostal retractions, no accessory muscle use. Heart: RRR,  no murmur.  Abdomen:  Not distended, soft, non-tender. No rebound or rigidity.   Lower extremities: no pretibial edema bilaterally  Skin: Exposed areas without rash. Not pale. Not jaundice Neurologic:  alert & oriented X3.  Speech normal, gait appropriate for age and unassisted Strength symmetric and appropriate for age.  Psych: Cognition and judgment appear intact.  Cooperative with normal attention span and concentration.  Behavior appropriate. No anxious or depressed appearing.     Assessment      Assessment  Diabetes:   A1c 6.2 (2011), A1c 6.5 (07-2019) Herpes labialis BC: condoms  PLAN: Here for CPX Diabetes: Discussed the patient she does have diabetes, diet and exercise are mandatory.  Resources provided, see AVS Borderline elevated  BP: Not following a low-salt diet, encouraged to do.  Recommend to start monitoring BPs regularly Headaches: Chronic but worsening lately, had a episode about 4 to 5 weeks ago, headache was intense and associated with nausea.  + Worst of life. Migraine? Other type of headache? plan: Set rate, brain MRI, reassess in 4 weeks, avoid prolonged fasting, try to decrease caffeine intake. Snoring: Epworth scale score 3, negative, reassess regularly RTC 4 weeks  In addition to CPX, we assess worsening problem (headache) and chronic issues including diabetes, snoring and borderline elevated BP  This visit occurred during the SARS-CoV-2 public health emergency.  Safety protocols were in place, including screening questions prior to the visit,  additional usage of staff PPE, and extensive cleaning of exam room while observing appropriate contact time as indicated for disinfecting solutions.

## 2020-08-09 ENCOUNTER — Encounter: Payer: Self-pay | Admitting: Internal Medicine

## 2020-08-09 LAB — LIPID PANEL
Cholesterol: 196 mg/dL (ref 0–200)
HDL: 62.3 mg/dL (ref >39.00)
LDL Cholesterol: 102 mg/dL — ABNORMAL HIGH (ref 0–99)
NonHDL: 133.62
Total CHOL/HDL Ratio: 3
Triglycerides: 156 mg/dL — ABNORMAL HIGH (ref 0.0–149.0)
VLDL: 31.2 mg/dL (ref 0.0–40.0)

## 2020-08-09 LAB — COMPREHENSIVE METABOLIC PANEL
ALT: 19 U/L (ref 0–35)
AST: 15 U/L (ref 0–37)
Albumin: 4.1 g/dL (ref 3.5–5.2)
Alkaline Phosphatase: 53 U/L (ref 39–117)
BUN: 12 mg/dL (ref 6–23)
CO2: 26 mEq/L (ref 19–32)
Calcium: 9.6 mg/dL (ref 8.4–10.5)
Chloride: 101 mEq/L (ref 96–112)
Creatinine, Ser: 0.97 mg/dL (ref 0.40–1.20)
GFR: 72.79 mL/min (ref >60.00)
Glucose, Bld: 91 mg/dL (ref 70–99)
Potassium: 4.2 mEq/L (ref 3.5–5.1)
Sodium: 135 mEq/L (ref 135–145)
Total Bilirubin: 0.3 mg/dL (ref 0.2–1.2)
Total Protein: 7 g/dL (ref 6.0–8.3)

## 2020-08-09 LAB — CBC WITH DIFFERENTIAL/PLATELET
Basophils Absolute: 0 10*3/uL (ref 0.0–0.1)
Basophils Relative: 0.8 % (ref 0.0–3.0)
Eosinophils Absolute: 0.2 10*3/uL (ref 0.0–0.7)
Eosinophils Relative: 3.3 % (ref 0.0–5.0)
HCT: 37.3 % (ref 36.0–46.0)
Hemoglobin: 12.4 g/dL (ref 12.0–15.0)
Lymphocytes Relative: 40.2 % (ref 12.0–46.0)
Lymphs Abs: 2.3 10*3/uL (ref 0.7–4.0)
MCHC: 33.3 g/dL (ref 30.0–36.0)
MCV: 80.9 fl (ref 78.0–100.0)
Monocytes Absolute: 0.5 10*3/uL (ref 0.1–1.0)
Monocytes Relative: 8.7 % (ref 3.0–12.0)
Neutro Abs: 2.6 10*3/uL (ref 1.4–7.7)
Neutrophils Relative %: 47 % (ref 43.0–77.0)
Platelets: 376 10*3/uL (ref 150.0–400.0)
RBC: 4.61 Mil/uL (ref 3.87–5.11)
RDW: 13.9 % (ref 11.5–15.5)
WBC: 5.6 10*3/uL (ref 4.0–10.5)

## 2020-08-09 LAB — TSH: TSH: 1.97 u[IU]/mL (ref 0.35–4.50)

## 2020-08-09 LAB — SEDIMENTATION RATE: Sed Rate: 51 mm/hr — ABNORMAL HIGH (ref 0–20)

## 2020-08-09 LAB — HEMOGLOBIN A1C: Hgb A1c MFr Bld: 6.5 % (ref 4.6–6.5)

## 2020-08-09 NOTE — Assessment & Plan Note (Signed)
-  Td 05-2018 -Covid vaccinations x2, plans to get a booster -Flu shot : undecided, encouraged to proceed - Female care: Per gynecology, per [facility]: PAP 2019; [facility] 12/2019 - CCS: Not indicated -Diet and exercise: Discussed, weight watchers, self-education (see AVS), wellness clinic. - labs: CMP, FLP, CBC, A1c, TSH, sed rate.

## 2020-08-09 NOTE — Assessment & Plan Note (Signed)
Here for CPX Diabetes: Discussed the patient she does have diabetes, diet and exercise are mandatory.  Resources provided, see AVS Borderline elevated BP: Not following a low-salt diet, encouraged to do.  Recommend to start monitoring BPs regularly Headaches: Chronic but worsening lately, had a episode about 4 to 5 weeks ago, headache was intense and associated with nausea.  + Worst of life. Migraine? Other type of headache? plan: Set rate, brain MRI, reassess in 4 weeks, avoid prolonged fasting, try to decrease caffeine intake. Snoring: Epworth scale score 3, negative, reassess regularly RTC 4 weeks

## 2020-08-31 ENCOUNTER — Encounter: Payer: Self-pay | Admitting: Internal Medicine

## 2020-09-02 ENCOUNTER — Ambulatory Visit (HOSPITAL_BASED_OUTPATIENT_CLINIC_OR_DEPARTMENT_OTHER): Payer: 59

## 2020-09-05 ENCOUNTER — Encounter: Payer: Self-pay | Admitting: Internal Medicine

## 2020-09-05 ENCOUNTER — Other Ambulatory Visit: Payer: Self-pay

## 2020-09-05 ENCOUNTER — Ambulatory Visit (INDEPENDENT_AMBULATORY_CARE_PROVIDER_SITE_OTHER): Payer: 59 | Admitting: Internal Medicine

## 2020-09-05 VITALS — BP 136/87 | HR 86 | Temp 98.0°F | Ht 65.5 in | Wt 237.0 lb

## 2020-09-05 DIAGNOSIS — R700 Elevated erythrocyte sedimentation rate: Secondary | ICD-10-CM | POA: Diagnosis not present

## 2020-09-05 DIAGNOSIS — E119 Type 2 diabetes mellitus without complications: Secondary | ICD-10-CM | POA: Diagnosis not present

## 2020-09-05 MED ORDER — IBUPROFEN 600 MG PO TABS
600.0000 mg | ORAL_TABLET | Freq: Three times a day (TID) | ORAL | 1 refills | Status: DC | PRN
Start: 2020-09-05 — End: 2021-08-14

## 2020-09-05 NOTE — Patient Instructions (Addendum)
  GO TO THE LAB : Get the blood work     GO TO THE FRONT DESK, PLEASE SCHEDULE YOUR APPOINTMENTS Come back for in 4 months

## 2020-09-05 NOTE — Progress Notes (Signed)
   Subjective:    Patient ID: Rachel Goodwin, female    DOB: May 20, 1981, 39 y.o.   MRN: [id number]  DOS:  09/05/2020 Type of visit - description: f/u  Follow-up from previous visit. Today we talk about her headaches, diabetes, diet, and recent slightly elevated sed rate.  Headaches are much improved, essentially no major headache since last visit.  She is avoiding fasting and has  a coffee every morning.  As far as the sed rate, he was a slightly elevated, she denies fever chills.  No URI type of symptoms recently, no LUTS.   Review of Systems See above   Past Medical History:  Diagnosis Date  . Abortion history    x1  . Genital herpes   . Prediabetes 08/2010   A1c 6.2  09/18/2010    Past Surgical History:  Procedure Laterality Date  . NO PAST SURGERIES      Allergies as of 09/05/2020   No Known Allergies     Medication List       Accurate as of September 05, 2020  1:54 PM. If you have any questions, ask your nurse or doctor.        valACYclovir 1000 MG tablet Commonly known as: Valtrex Take 2 tablets by mouth twice daily for 1 day as needed for fever blisters          Objective:   Physical Exam BP 136/87 (BP Location: Right Arm, Patient Position: Sitting, Cuff Size: Large)   Pulse 86   Temp 98 F (36.7 C) (Oral)   Ht 5' 5.5" (1.664 m)   Wt 237 lb (107.5 kg)   SpO2 99%   BMI 38.84 kg/m  General:   Well developed, NAD, BMI noted. HEENT:  Normocephalic . Face symmetric, atraumatic Skin: Not pale. Not jaundice Neurologic:  alert & oriented X3.  Speech normal, gait appropriate for age and unassisted Psych--  Cognition and judgment appear intact.  Cooperative with normal attention span and concentration.  Behavior appropriate. No anxious or depressed appearing.      Assessment      Assessment  Diabetes:   A1c 6.2 (2011), A1c 6.5 (07-2019) Herpes labialis BC: condoms Morbid obesity  PLAN: DM: Since last visit she is trying to do better with  diet and exercise, we have extensive discussion about it.  We talk about calorie counting, wellness clinic, weight watchers, etc. Statins: Advised patient that every diabetic patient should be on a statin, will consider that in the future. Headaches: Since the last visit no further severe headaches, she is avoiding fasting and drinking the coffee every morning and that apparently took care of the problem. Sed rate elevation: Mild elevation, she is asx, recheck today. Request a refill on ibuprofen, she takes it rarely, GI precautions encouraged. Morbid obesity: See comments under DM. Preventive care: Declined flu shot RTC for a checkup in 4 months   This visit occurred during the SARS-CoV-2 public health emergency.  Safety protocols were in place, including screening questions prior to the visit, additional usage of staff PPE, and extensive cleaning of exam room while observing appropriate contact time as indicated for disinfecting solutions.

## 2020-09-06 DIAGNOSIS — E669 Obesity, unspecified: Secondary | ICD-10-CM | POA: Insufficient documentation

## 2020-09-06 DIAGNOSIS — E66811 Obesity, class 1: Secondary | ICD-10-CM | POA: Insufficient documentation

## 2020-09-06 LAB — SEDIMENTATION RATE: Sed Rate: 27 mm/hr — ABNORMAL HIGH (ref 0–20)

## 2020-09-06 NOTE — Assessment & Plan Note (Addendum)
DM: Since last visit she is trying to do better with diet and exercise, we have extensive discussion about it.  We talk about calorie counting, wellness clinic, weight watchers, etc. Statins: Advised patient that every diabetic patient should be on a statin, will consider that in the future. Headaches: Since the last visit no further severe headaches, she is avoiding fasting and drinking the coffee every morning and that apparently took care of the problem. Sed rate elevation: Mild elevation, she is asx, recheck today. Request a refill on ibuprofen, she takes it rarely, GI precautions encouraged. Morbid obesity: See comments under DM. Preventive care: Declined flu shot RTC for a checkup in 4 months

## 2021-01-03 ENCOUNTER — Ambulatory Visit: Payer: 59 | Admitting: Internal Medicine

## 2021-01-11 ENCOUNTER — Ambulatory Visit (INDEPENDENT_AMBULATORY_CARE_PROVIDER_SITE_OTHER): Payer: 59 | Admitting: Internal Medicine

## 2021-01-11 ENCOUNTER — Other Ambulatory Visit: Payer: Self-pay

## 2021-01-11 VITALS — BP 127/87 | HR 72 | Temp 97.0°F | Ht 65.5 in | Wt 219.0 lb

## 2021-01-11 DIAGNOSIS — E119 Type 2 diabetes mellitus without complications: Secondary | ICD-10-CM

## 2021-01-11 DIAGNOSIS — Z1159 Encounter for screening for other viral diseases: Secondary | ICD-10-CM

## 2021-01-11 LAB — HEMOGLOBIN A1C: Hgb A1c MFr Bld: 6.2 % (ref 4.6–6.5)

## 2021-01-11 LAB — MICROALBUMIN / CREATININE URINE RATIO
Creatinine,U: 80.4 mg/dL
Microalb Creat Ratio: 0.9 mg/g (ref 0.0–30.0)
Microalb, Ur: <0.7 mg/dL (ref 0.0–1.9)

## 2021-01-11 NOTE — Progress Notes (Signed)
   Subjective:    Patient ID: Rachel Goodwin, female    DOB: 1981/02/20, 40 y.o.   MRN: [id number]  DOS:  01/11/2021 Type of visit - description: Follow-up  Since the last office visit is doing great. Weight loss noted. Doing great with diet, started to exercise 2 weeks ago. She denies any lower extremity paresthesias. Headaches are greatly decreased.  Wt Readings from Last 3 Encounters:  01/11/21 219 lb (99.3 kg)  09/05/20 237 lb (107.5 kg)  08/08/20 235 lb (106.6 kg)    Review of Systems See above   Past Medical History:  Diagnosis Date  . Abortion history    x1  . Genital herpes   . Prediabetes 08/2010   A1c 6.2  09/18/2010    Past Surgical History:  Procedure Laterality Date  . NO PAST SURGERIES      Allergies as of 01/11/2021   No Known Allergies     Medication List       Accurate as of January 11, 2021  9:52 AM. If you have any questions, ask your nurse or doctor.        ibuprofen 600 MG tablet Commonly known as: ADVIL Take 1 tablet (600 mg total) by mouth 3 (three) times daily as needed for headache.   valACYclovir 1000 MG tablet Commonly known as: Valtrex Take 2 tablets by mouth twice daily for 1 day as needed for fever blisters          Objective:   Physical Exam BP 127/87 (BP Location: Left Arm, Patient Position: Sitting, Cuff Size: Large)   Pulse 72   Temp (!) 97 F (36.1 C) (Temporal)   Ht 5' 5.5" (1.664 m)   Wt 219 lb (99.3 kg)   SpO2 98%   BMI 35.89 kg/m  General:   Well developed, NAD, BMI noted. HEENT:  Normocephalic . Face symmetric, atraumatic Lungs:  CTA B Normal respiratory effort, no intercostal retractions, no accessory muscle use. Heart: RRR,  no murmur.  DM foot exam: No edema, good pedal pulses, pinprick examination normal Neurologic:  alert & oriented X3.  Speech normal, gait appropriate for age and unassisted Psych--  Cognition and judgment appear intact.  Cooperative with normal attention span and  concentration.  Behavior appropriate. No anxious or depressed appearing.      Assessment      Assessment  Diabetes:   A1c 6.2 (2011), A1c 6.5 (07-2019) Herpes labialis BC: condoms Morbid obesity  PLAN: DM: Since the last visit, has lost approximately 18 pounds, doing a program that includes moderation, portion control and decrease carbohydrates.  She started to exercise 2 weeks ago.  Foot exam negative today. Patient is praised, check A1c. Morbid obesity: Doing great, see above. Headaches: Greatly decreased, had a headache once every few months. Preventive care: Check hep C RTC 07-2021 CPX.  This visit occurred during the SARS-CoV-2 public health emergency.  Safety protocols were in place, including screening questions prior to the visit, additional usage of staff PPE, and extensive cleaning of exam room while observing appropriate contact time as indicated for disinfecting solutions.

## 2021-01-11 NOTE — Patient Instructions (Addendum)
Per our records you are due for an eye exam. Please contact your eye doctor to schedule an appointment. Please have them send copies of your office visit notes to [place]. Our fax number is [phone].  You are doing great !  GO TO THE LAB : Get the blood work     GO TO THE FRONT DESK, PLEASE SCHEDULE YOUR APPOINTMENTS Come back for   a physical exam by November 2022

## 2021-01-12 LAB — HEPATITIS C ANTIBODY
Hepatitis C Ab: NONREACTIVE
SIGNAL TO CUT-OFF: 0.01 (ref <1.00)

## 2021-01-13 NOTE — Assessment & Plan Note (Signed)
DM: Since the last visit, has lost approximately 18 pounds, doing a program that includes moderation, portion control and decrease carbohydrates.  She started to exercise 2 weeks ago.  Foot exam negative today. Patient is praised, check A1c. Morbid obesity: Doing great, see above. Headaches: Greatly decreased, had a headache once every few months. Preventive care: Check hep C RTC 07-2021 CPX.

## 2021-01-15 ENCOUNTER — Encounter: Payer: Self-pay | Admitting: Internal Medicine

## 2021-02-05 ENCOUNTER — Other Ambulatory Visit: Payer: Self-pay | Admitting: Internal Medicine

## 2021-02-05 DIAGNOSIS — Z1231 Encounter for screening mammogram for malignant neoplasm of breast: Secondary | ICD-10-CM

## 2021-02-07 ENCOUNTER — Ambulatory Visit
Admission: RE | Admit: 2021-02-07 | Discharge: 2021-02-07 | Disposition: A | Discharge status: Home / Self Care | Payer: 59 | Source: Ambulatory Visit | Attending: Internal Medicine | Admitting: Internal Medicine

## 2021-02-07 ENCOUNTER — Other Ambulatory Visit: Payer: Self-pay

## 2021-02-07 DIAGNOSIS — Z1231 Encounter for screening mammogram for malignant neoplasm of breast: Secondary | ICD-10-CM

## 2021-08-13 ENCOUNTER — Other Ambulatory Visit: Payer: Self-pay

## 2021-08-14 ENCOUNTER — Encounter: Payer: Self-pay | Admitting: Internal Medicine

## 2021-08-14 ENCOUNTER — Ambulatory Visit (INDEPENDENT_AMBULATORY_CARE_PROVIDER_SITE_OTHER): Payer: 59 | Admitting: Internal Medicine

## 2021-08-14 VITALS — BP 126/82 | HR 71 | Temp 98.2°F | Resp 16 | Ht 66.0 in | Wt 197.0 lb

## 2021-08-14 DIAGNOSIS — E119 Type 2 diabetes mellitus without complications: Secondary | ICD-10-CM

## 2021-08-14 DIAGNOSIS — Z0000 Encounter for general adult medical examination without abnormal findings: Secondary | ICD-10-CM

## 2021-08-14 LAB — COMPREHENSIVE METABOLIC PANEL
ALT: 9 U/L (ref 0–35)
AST: 12 U/L (ref 0–37)
Albumin: 4.4 g/dL (ref 3.5–5.2)
Alkaline Phosphatase: 47 U/L (ref 39–117)
BUN: 13 mg/dL (ref 6–23)
CO2: 27 mEq/L (ref 19–32)
Calcium: 9.7 mg/dL (ref 8.4–10.5)
Chloride: 103 mEq/L (ref 96–112)
Creatinine, Ser: 0.96 mg/dL (ref 0.40–1.20)
GFR: 73.17 mL/min (ref >60.00)
Glucose, Bld: 89 mg/dL (ref 70–99)
Potassium: 4.3 mEq/L (ref 3.5–5.1)
Sodium: 137 mEq/L (ref 135–145)
Total Bilirubin: 0.4 mg/dL (ref 0.2–1.2)
Total Protein: 7.5 g/dL (ref 6.0–8.3)

## 2021-08-14 LAB — LIPID PANEL
Cholesterol: 219 mg/dL — ABNORMAL HIGH (ref 0–200)
HDL: 54.5 mg/dL (ref >39.00)
LDL Cholesterol: 152 mg/dL — ABNORMAL HIGH (ref 0–99)
NonHDL: 164.51
Total CHOL/HDL Ratio: 4
Triglycerides: 64 mg/dL (ref 0.0–149.0)
VLDL: 12.8 mg/dL (ref 0.0–40.0)

## 2021-08-14 LAB — CBC WITH DIFFERENTIAL/PLATELET
Basophils Absolute: 0 10*3/uL (ref 0.0–0.1)
Basophils Relative: 0.6 % (ref 0.0–3.0)
Eosinophils Absolute: 0.1 10*3/uL (ref 0.0–0.7)
Eosinophils Relative: 3.2 % (ref 0.0–5.0)
HCT: 39.6 % (ref 36.0–46.0)
Hemoglobin: 12.7 g/dL (ref 12.0–15.0)
Lymphocytes Relative: 52.4 % — ABNORMAL HIGH (ref 12.0–46.0)
Lymphs Abs: 1.7 10*3/uL (ref 0.7–4.0)
MCHC: 32.1 g/dL (ref 30.0–36.0)
MCV: 82.1 fl (ref 78.0–100.0)
Monocytes Absolute: 0.3 10*3/uL (ref 0.1–1.0)
Monocytes Relative: 7.9 % (ref 3.0–12.0)
Neutro Abs: 1.2 10*3/uL — ABNORMAL LOW (ref 1.4–7.7)
Neutrophils Relative %: 35.9 % — ABNORMAL LOW (ref 43.0–77.0)
Platelets: 341 10*3/uL (ref 150.0–400.0)
RBC: 4.82 Mil/uL (ref 3.87–5.11)
RDW: 14 % (ref 11.5–15.5)
WBC: 3.2 10*3/uL — ABNORMAL LOW (ref 4.0–10.5)

## 2021-08-14 LAB — HEMOGLOBIN A1C: Hgb A1c MFr Bld: 6.5 % (ref 4.6–6.5)

## 2021-08-14 MED ORDER — IBUPROFEN 600 MG PO TABS: 600.0000 mg | ORAL_TABLET | Freq: Three times a day (TID) | ORAL | 1 refills | Status: DC | PRN

## 2021-08-14 NOTE — Assessment & Plan Note (Signed)
-  Td 05-2018 -Covid vax: bivalent booster rec  -Flu shot: recommended , declines  - Female care: Per gynecology, per [facility]: PAP 2019, plans to call and schedule a gyn f/u visit;  [facility] 01/2021 - CCS: Not indicated -Diet and exercise: Doing great, started a Facebook group, eating healthier, increase exercise, + weight loss. - labs:  CMP, FLP, CBC, A1c

## 2021-08-14 NOTE — Assessment & Plan Note (Signed)
Assessment  Diabetes:   A1c 6.2 (2011), A1c 6.5 (07-2019) Herpes labialis BC: condoms Morbid obesity  PLAN: Here for CPX DM: Has significantly changed her diet, + weight loss noted, praised!.  Check A1c. Request ibuprofen, she takes it sporadically for aches and pains Constipation: Recommend increase fiber intake, fluids, Colace as needed, MiraLAX as needed.  See AVS. RTC 1 year.  Sooner depending on labs.

## 2021-08-14 NOTE — Progress Notes (Signed)
Subjective:    Patient ID: Rachel Goodwin, female    DOB: 12/22/1980, 40 y.o.   MRN: [id number]  DOS:  08/14/2021 Type of visit - description: CPX  Since the last office visit is doing well. Has lost weight, eating healthier. She does complain of constipation sometimes, having bowel movements 3 times a week with some straining. No nausea vomiting.  No fever chills.  Wt Readings from Last 3 Encounters:  08/14/21 197 lb (89.4 kg)  01/11/21 219 lb (99.3 kg)  09/05/20 237 lb (107.5 kg)    Review of Systems  Other than above, a 14 point review of systems is negative     Past Medical History:  Diagnosis Date   Abortion history    x1   Genital herpes    Prediabetes 08/2010   A1c 6.2  09/18/2010    Past Surgical History:  Procedure Laterality Date   NO PAST SURGERIES     Social History   Socioeconomic History   Marital status: Single    Spouse name: Not on file   Number of children: 0   Years of education: Not on file   Highest education level: Not on file  Occupational History   Occupation: works for a health plan    Comment: remote   Occupation: has a Doctor, 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[place], community  Tobacco Use   Smoking status: Never   Smokeless tobacco: Never  Substance and Sexual Activity   Alcohol use: Yes    Comment: socially    Drug use: No   Sexual activity: Not on file  Other Topics Concern   Not on file  Social History Narrative   Lives by herself         Social Determinants of Health   Financial Resource Strain: Not on file  Food Insecurity: Not on file  Transportation Needs: Not on file  Physical Activity: Not on file  Stress: Not on file  Social Connections: Not on file  Intimate Partner Violence: Not on file    Allergies as of 08/14/2021   No Known Allergies      Medication List        Accurate as of August 14, 2021  1:50 PM. If you have any questions, ask your nurse or doctor.          ibuprofen 600 MG tablet Commonly known as: ADVIL Take 1 tablet  (600 mg total) by mouth 3 (three) times daily as needed for headache.   valACYclovir 1000 MG tablet Commonly known as: Valtrex Take 2 tablets by mouth twice daily for 1 day as needed for fever blisters           Objective:   Physical Exam BP 126/82 (BP Location: Left Arm, Patient Position: Sitting, Cuff Size: Normal)   Pulse 71   Temp 98.2 F (36.8 C) (Oral)   Resp 16   Ht 5\' 6"  (1.676 m)   Wt 197 lb (89.4 kg)   SpO2 98%   BMI 31.80 kg/m  General: Well developed, NAD, BMI noted Neck: No  thyromegaly  HEENT:  Normocephalic . Face symmetric, atraumatic Lungs:  CTA B Normal respiratory effort, no intercostal retractions, no accessory muscle use. Heart: RRR,  no murmur.  Abdomen:  Not distended, soft, non-tender. No rebound or rigidity.   Lower extremities: no pretibial edema bilaterally  Skin: Exposed areas without rash. Not pale. Not jaundice Neurologic:  alert & oriented X3.  Speech normal, gait appropriate for age and unassisted Strength symmetric and appropriate  for age.  Psych: Cognition and judgment appear intact.  Cooperative with normal attention span and concentration.  Behavior appropriate. No anxious or depressed appearing.     Assessment      Assessment  Diabetes:   A1c 6.2 (2011), A1c 6.5 (07-2019) Herpes labialis BC: condoms Morbid obesity  PLAN: Here for CPX DM: Has significantly changed her diet, + weight loss noted, praised!.  Check A1c. Request ibuprofen, she takes it sporadically for aches and pains Constipation: Recommend increase fiber intake, fluids, Colace as needed, MiraLAX as needed.  See AVS. RTC 1 year.  Sooner depending on labs.   This visit occurred during the SARS-CoV-2 public health emergency.  Safety protocols were in place, including screening questions prior to the visit, additional usage of staff PPE, and extensive cleaning of exam room while observing appropriate contact time as indicated for disinfecting solutions.

## 2021-08-14 NOTE — Patient Instructions (Addendum)
Per our records you are due for your diabetic eye exam. Please contact your eye doctor to schedule an appointment. Please have them send copies of your office visit notes to [place]. Our fax number is [phone]. If you need a referral to an eye doctor please let us know.   For constipation: Start Metamucil 1 capsule daily with your biggest meal. Always drink plenty of fluids Okay to take a over-the-counter stool softener like Colace, daily if needed. If you feel constipated you can take MiraLAX 17 g daily.  You can do that for several days. If these is not helping please let me know  GO TO THE LAB : Get the blood work     GO TO THE FRONT DESK, PLEASE SCHEDULE YOUR APPOINTMENTS Come back for a physical exam in 1 year

## 2021-08-15 ENCOUNTER — Telehealth: Payer: Self-pay

## 2021-08-15 NOTE — Telephone Encounter (Signed)
Physical form completed and faxed to [facility] at [phone]. Form sent for scanning.

## 2021-08-15 NOTE — Telephone Encounter (Signed)
Received fax confirmation

## 2021-08-22 ENCOUNTER — Encounter: Payer: Self-pay | Admitting: Internal Medicine

## 2021-11-28 ENCOUNTER — Encounter: Payer: Self-pay | Admitting: Internal Medicine

## 2022-02-18 ENCOUNTER — Encounter: Payer: Self-pay | Admitting: Internal Medicine

## 2022-03-12 ENCOUNTER — Other Ambulatory Visit: Payer: Self-pay | Admitting: Internal Medicine

## 2022-03-12 DIAGNOSIS — Z1231 Encounter for screening mammogram for malignant neoplasm of breast: Secondary | ICD-10-CM

## 2022-03-19 ENCOUNTER — Ambulatory Visit
Admission: RE | Admit: 2022-03-19 | Discharge: 2022-03-19 | Disposition: A | Discharge status: Home / Self Care | Payer: 59 | Source: Ambulatory Visit | Attending: Internal Medicine | Admitting: Internal Medicine

## 2022-03-19 DIAGNOSIS — Z1231 Encounter for screening mammogram for malignant neoplasm of breast: Secondary | ICD-10-CM

## 2022-05-21 ENCOUNTER — Encounter: Payer: Self-pay | Admitting: Internal Medicine

## 2022-06-10 LAB — HM DIABETES EYE EXAM

## 2022-08-15 ENCOUNTER — Encounter: Payer: Self-pay | Admitting: Internal Medicine

## 2022-08-15 ENCOUNTER — Ambulatory Visit (INDEPENDENT_AMBULATORY_CARE_PROVIDER_SITE_OTHER): Payer: 59 | Admitting: Internal Medicine

## 2022-08-15 VITALS — BP 116/70 | HR 55 | Temp 98.1°F | Resp 16 | Ht 66.0 in | Wt 198.0 lb

## 2022-08-15 DIAGNOSIS — Z01419 Encounter for gynecological examination (general) (routine) without abnormal findings: Secondary | ICD-10-CM | POA: Diagnosis not present

## 2022-08-15 DIAGNOSIS — Z1272 Encounter for screening for malignant neoplasm of vagina: Secondary | ICD-10-CM

## 2022-08-15 DIAGNOSIS — E119 Type 2 diabetes mellitus without complications: Secondary | ICD-10-CM | POA: Diagnosis not present

## 2022-08-15 DIAGNOSIS — E785 Hyperlipidemia, unspecified: Secondary | ICD-10-CM

## 2022-08-15 DIAGNOSIS — Z0000 Encounter for general adult medical examination without abnormal findings: Secondary | ICD-10-CM

## 2022-08-15 LAB — CBC WITH DIFFERENTIAL/PLATELET
Basophils Absolute: 0 10*3/uL (ref 0.0–0.1)
Basophils Relative: 0.8 % (ref 0.0–3.0)
Eosinophils Absolute: 0.1 10*3/uL (ref 0.0–0.7)
Eosinophils Relative: 4.2 % (ref 0.0–5.0)
HCT: 40 % (ref 36.0–46.0)
Hemoglobin: 13.1 g/dL (ref 12.0–15.0)
Lymphocytes Relative: 52.3 % — ABNORMAL HIGH (ref 12.0–46.0)
Lymphs Abs: 1.7 10*3/uL (ref 0.7–4.0)
MCHC: 32.9 g/dL (ref 30.0–36.0)
MCV: 83.2 fl (ref 78.0–100.0)
Monocytes Absolute: 0.3 10*3/uL (ref 0.1–1.0)
Monocytes Relative: 9.4 % (ref 3.0–12.0)
Neutro Abs: 1.1 10*3/uL — ABNORMAL LOW (ref 1.4–7.7)
Neutrophils Relative %: 33.3 % — ABNORMAL LOW (ref 43.0–77.0)
Platelets: 325 10*3/uL (ref 150.0–400.0)
RBC: 4.8 Mil/uL (ref 3.87–5.11)
RDW: 14.2 % (ref 11.5–15.5)
WBC: 3.3 10*3/uL — ABNORMAL LOW (ref 4.0–10.5)

## 2022-08-15 LAB — COMPREHENSIVE METABOLIC PANEL
ALT: 12 U/L (ref 0–35)
AST: 14 U/L (ref 0–37)
Albumin: 4.3 g/dL (ref 3.5–5.2)
Alkaline Phosphatase: 37 U/L — ABNORMAL LOW (ref 39–117)
BUN: 12 mg/dL (ref 6–23)
CO2: 27 mEq/L (ref 19–32)
Calcium: 9.3 mg/dL (ref 8.4–10.5)
Chloride: 104 mEq/L (ref 96–112)
Creatinine, Ser: 0.94 mg/dL (ref 0.40–1.20)
GFR: 74.52 mL/min (ref >60.00)
Glucose, Bld: 89 mg/dL (ref 70–99)
Potassium: 4.5 mEq/L (ref 3.5–5.1)
Sodium: 134 mEq/L — ABNORMAL LOW (ref 135–145)
Total Bilirubin: 0.6 mg/dL (ref 0.2–1.2)
Total Protein: 7.3 g/dL (ref 6.0–8.3)

## 2022-08-15 LAB — LIPID PANEL
Cholesterol: 230 mg/dL — ABNORMAL HIGH (ref 0–200)
HDL: 63.3 mg/dL (ref >39.00)
LDL Cholesterol: 154 mg/dL — ABNORMAL HIGH (ref 0–99)
NonHDL: 166.46
Total CHOL/HDL Ratio: 4
Triglycerides: 63 mg/dL (ref 0.0–149.0)
VLDL: 12.6 mg/dL (ref 0.0–40.0)

## 2022-08-15 LAB — MICROALBUMIN / CREATININE URINE RATIO
Creatinine,U: 47.2 mg/dL
Microalb Creat Ratio: 1.5 mg/g (ref 0.0–30.0)
Microalb, Ur: <0.7 mg/dL (ref 0.0–1.9)

## 2022-08-15 LAB — HEMOGLOBIN A1C: Hgb A1c MFr Bld: 6.1 % (ref 4.6–6.5)

## 2022-08-15 MED ORDER — IBUPROFEN 600 MG PO TABS: 600.0000 mg | ORAL_TABLET | Freq: Three times a day (TID) | ORAL | 1 refills | Status: DC | PRN

## 2022-08-15 NOTE — Assessment & Plan Note (Signed)
-  Td 05-2018 -Covid vax:  booster rec  -Flu shot: recommended , declines  - Female care:  refer to gyn in [place]  MM[facility] 02/2022 - CCS: Not indicated -Diet and exercise: Continues to do well, see comments under history of morbid obesity - labs:   CMP, FLP, CBC, A1c, micro.

## 2022-08-15 NOTE — Progress Notes (Signed)
Subjective:    Patient ID: Rachel Goodwin, female    DOB: [birth year], 41 y.o.   MRN: [id number]  DOS:  08/15/2022 Type of visit - description: CPX  Here for CPX. Feeling well. No major concerns. Denies lower extremity paresthesias  Wt Readings from Last 3 Encounters:  08/15/22 198 lb (89.8 kg)  08/14/21 197 lb (89.4 kg)  01/11/21 219 lb (99.3 kg)     Review of Systems   A 14 point review of systems is negative    Past Medical History:  Diagnosis Date   Abortion history    x1   Genital herpes     Past Surgical History:  Procedure Laterality Date   NO PAST SURGERIES     Social History   Socioeconomic History   Marital status: Single    Spouse name: Not on file   Number of children: 0   Years of education: Not on file   Highest education level: Not on file  Occupational History   Occupation: works for a health plan    Comment: remote   Occupation: has a Doctor, 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[place]  Tobacco Use   Smoking status: Never   Smokeless tobacco: Never  Substance and Sexual Activity   Alcohol use: Yes    Comment: socially    Drug use: No   Sexual activity: Not on file  Other Topics Concern   Not on file  Social History Narrative   Lives by herself         Social Determinants of Health   Financial Resource Strain: Not on file  Food Insecurity: Not on file  Transportation Needs: Not on file  Physical Activity: Not on file  Stress: Not on file  Social Connections: Not on file  Intimate Partner Violence: Not on file    Current Outpatient Medications  Medication Instructions   ibuprofen (ADVIL) 600 mg, Oral, 3 times daily PRN   valACYclovir (VALTREX) 1000 MG tablet Take 2 tablets by mouth twice daily for 1 day as needed for fever blisters       Objective:   Physical Exam BP 116/70   Pulse (!) 55   Temp 98.1 F (36.7 C) (Oral)   Resp 16   Ht 5\' 6"  (1.676 m)   Wt 198 lb (89.8 kg)   LMP 07/25/2022 (Approximate)   SpO2 99%   BMI 31.96 kg/m  General: Well  developed, NAD, BMI noted Neck: No  thyromegaly  HEENT:  Normocephalic . Face symmetric, atraumatic Lungs:  CTA B Normal respiratory effort, no intercostal retractions, no accessory muscle use. Heart: RRR,  no murmur.  Abdomen:  Not distended, soft, non-tender. No rebound or rigidity.   DM foot exam: No edema, good pedal pulses, pinprick examination normal Skin: Exposed areas without rash. Not pale. Not jaundice Neurologic:  alert & oriented X3.  Speech normal, gait appropriate for age and unassisted Strength symmetric and appropriate for age.  Psych: Cognition and judgment appear intact.  Cooperative with normal attention span and concentration.  Behavior appropriate. No anxious or depressed appearing.     Assessment    Assessment  Diabetes:   A1c 6.2 (2011), A1c 6.5 (07-2019) Herpes labialis BC: condoms H/o  Morbid obesity  PLAN: Here for CPX DM: Diet controlled, checking labs.  Foot exam negative.  Aware that the statins would be indicated in diabetes, she is willing to pursue. History of morbid obesity: Doing well, has not gained weight.  She actually lost some additional pounds but regained them.  Plans to continue her efforts. RTC 6 months

## 2022-08-15 NOTE — Assessment & Plan Note (Signed)
Here for CPX DM: Diet controlled, checking labs.  Foot exam negative.  Aware that the statins would be indicated in diabetes, she is willing to pursue. History of morbid obesity: Doing well, has not gained weight.  She actually lost some additional pounds but regained them.  Plans to continue her efforts. RTC 6 months

## 2022-08-15 NOTE — Patient Instructions (Addendum)
You are doing very well.  We are referring you to a gynecologist in [place]  Vaccines I recommend:  Covid booster Flu shot   GO TO THE LAB : Get the blood work     GO TO THE FRONT DESK, PLEASE SCHEDULE YOUR APPOINTMENTS Come back for a checkup in 6 months

## 2022-08-16 ENCOUNTER — Encounter: Payer: Self-pay | Admitting: Internal Medicine

## 2022-08-16 NOTE — Telephone Encounter (Signed)
Completed physical form faxed back to [facility] at [phone]. Form sent for scanning.

## 2022-08-19 ENCOUNTER — Telehealth: Payer: Self-pay | Admitting: Internal Medicine

## 2022-08-19 MED ORDER — ATORVASTATIN CALCIUM 20 MG PO TABS: 20.0000 mg | ORAL_TABLET | Freq: Every day | ORAL | 1 refills | Status: DC

## 2022-08-19 NOTE — Addendum Note (Signed)
Addended byConrad Randsburg D on: 08/19/2022 07:42 AM   Modules accepted: Orders

## 2022-08-19 NOTE — Telephone Encounter (Signed)
Labs are an approximate date. Okay to leave them set for 09/2021.

## 2022-08-19 NOTE — Telephone Encounter (Signed)
Provider requested repeat labs in 3 months but future order only goes to 09/30/22.Patient appt scheduled 11/19/22. Please extend lab order unless she needs to come in sooner for labs.

## 2022-08-20 ENCOUNTER — Encounter: Payer: Self-pay | Admitting: Internal Medicine

## 2022-11-19 ENCOUNTER — Other Ambulatory Visit: Payer: 59

## 2022-12-10 ENCOUNTER — Other Ambulatory Visit: Payer: 59

## 2023-01-07 ENCOUNTER — Encounter: Payer: Self-pay | Admitting: Internal Medicine

## 2023-02-25 ENCOUNTER — Ambulatory Visit: Payer: 59 | Admitting: Internal Medicine

## 2023-03-11 ENCOUNTER — Ambulatory Visit (INDEPENDENT_AMBULATORY_CARE_PROVIDER_SITE_OTHER): Payer: 59 | Admitting: Internal Medicine

## 2023-03-11 ENCOUNTER — Encounter: Payer: Self-pay | Admitting: Internal Medicine

## 2023-03-11 VITALS — BP 130/82 | HR 73 | Temp 98.1°F | Resp 16 | Ht 66.0 in | Wt 205.1 lb

## 2023-03-11 DIAGNOSIS — E669 Obesity, unspecified: Secondary | ICD-10-CM

## 2023-03-11 DIAGNOSIS — E785 Hyperlipidemia, unspecified: Secondary | ICD-10-CM

## 2023-03-11 DIAGNOSIS — E119 Type 2 diabetes mellitus without complications: Secondary | ICD-10-CM

## 2023-03-11 DIAGNOSIS — Z6833 Body mass index (BMI) 33.0-33.9, adult: Secondary | ICD-10-CM

## 2023-03-11 LAB — BASIC METABOLIC PANEL
BUN: 13 mg/dL (ref 6–23)
CO2: 23 mEq/L (ref 19–32)
Calcium: 9.4 mg/dL (ref 8.4–10.5)
Chloride: 103 mEq/L (ref 96–112)
Creatinine, Ser: 1 mg/dL (ref 0.40–1.20)
GFR: 68.91 mL/min (ref >60.00)
Glucose, Bld: 84 mg/dL (ref 70–99)
Potassium: 4.3 mEq/L (ref 3.5–5.1)
Sodium: 133 mEq/L — ABNORMAL LOW (ref 135–145)

## 2023-03-11 LAB — HEMOGLOBIN A1C: Hgb A1c MFr Bld: 6 % (ref 4.6–6.5)

## 2023-03-11 MED ORDER — ATORVASTATIN CALCIUM 20 MG PO TABS: 20.0000 mg | ORAL_TABLET | Freq: Every day | ORAL | 1 refills | Status: DC

## 2023-03-11 NOTE — Assessment & Plan Note (Signed)
DM: Diet controlled, check A1c and BMP Hyperlipidemia: d/t  insurance issues has not been able to start atorvastatin but plans to do.  Recommend to RTC for blood work 6 weeks after atorvastatin. Obesity: Let her guard down, + wt gain, plans to go back on a  healthier diet, encouraged 3 hours of exercise a week. RTC CPX 08-2023

## 2023-03-11 NOTE — Progress Notes (Signed)
   Subjective:    Patient ID: Rachel Goodwin, female    DOB: [birth year], 42 y.o.   MRN: [id number]  DOS:  03/11/2023 Type of visit - description: Follow-up  In general feeling well.  Chronic medical problems were addressed today.  Wt Readings from Last 3 Encounters:  03/11/23 205 lb 2 oz (93 kg)  08/15/22 198 lb (89.8 kg)  08/14/21 197 lb (89.4 kg)    Review of Systems See above   Past Medical History:  Diagnosis Date   Abortion history    x1   Genital herpes     Past Surgical History:  Procedure Laterality Date   NO PAST SURGERIES      Current Outpatient Medications  Medication Instructions   atorvastatin (LIPITOR) 20 mg, Oral, Daily at bedtime   ibuprofen (ADVIL) 600 mg, Oral, 3 times daily PRN   valACYclovir (VALTREX) 1000 MG tablet Take 2 tablets by mouth twice daily for 1 day as needed for fever blisters       Objective:   Physical Exam BP 130/82   Pulse 73   Temp 98.1 F (36.7 C) (Oral)   Resp 16   Ht 5\' 6"  (1.676 m)   Wt 205 lb 2 oz (93 kg)   LMP 02/21/2023 (Exact Date)   SpO2 98%   BMI 33.11 kg/m  General:   Well developed, NAD, BMI noted. HEENT:  Normocephalic . Face symmetric, atraumatic Lungs:  CTA B Normal respiratory effort, no intercostal retractions, no accessory muscle use. Heart: RRR,  no murmur.  Lower extremities: no pretibial edema bilaterally  Skin: Not pale. Not jaundice Neurologic:  alert & oriented X3.  Speech normal, gait appropriate for age and unassisted Psych--  Cognition and judgment appear intact.  Cooperative with normal attention span and concentration.  Behavior appropriate. No anxious or depressed appearing.      Assessment     Assessment  Diabetes:   A1c 6.2 (2011), A1c 6.5 (07-2019) Hyperlipidemia  Herpes labialis BC: condoms H/o  Morbid obesity  PLAN: DM: Diet controlled, check A1c and BMP Hyperlipidemia: d/t  insurance issues has not been able to start atorvastatin but plans to do.  Recommend to RTC  for blood work 6 weeks after atorvastatin. Obesity: Let her guard down, + wt gain, plans to go back on a  healthier diet, encouraged 3 hours of exercise a week. RTC CPX 08-2023

## 2023-03-11 NOTE — Patient Instructions (Addendum)
6 weeks after you start the cholesterol medication, call the office and make an appointment for blood work.  Vaccines I recommend: Covid booster    GO TO THE LAB : Get the blood work  today   GO TO THE FRONT DESK, PLEASE SCHEDULE YOUR APPOINTMENTS Come back for   physical exam by November 2024

## 2023-03-20 ENCOUNTER — Other Ambulatory Visit: Payer: Self-pay | Admitting: Internal Medicine

## 2023-03-20 DIAGNOSIS — Z1231 Encounter for screening mammogram for malignant neoplasm of breast: Secondary | ICD-10-CM

## 2023-04-11 ENCOUNTER — Encounter: Payer: Self-pay | Admitting: Internal Medicine

## 2023-04-14 MED ORDER — IBUPROFEN 600 MG PO TABS: 600.0000 mg | ORAL_TABLET | Freq: Three times a day (TID) | ORAL | 1 refills | Status: AC | PRN

## 2023-04-14 MED ORDER — ATORVASTATIN CALCIUM 20 MG PO TABS: 20.0000 mg | ORAL_TABLET | Freq: Every day | ORAL | 1 refills | Status: DC

## 2023-04-14 NOTE — Addendum Note (Signed)
Addended byConrad Bellbrook D on: 04/14/2023 07:54 AM   Modules accepted: Orders

## 2023-04-22 ENCOUNTER — Ambulatory Visit
Admission: RE | Admit: 2023-04-22 | Discharge: 2023-04-22 | Disposition: A | Discharge status: Home / Self Care | Payer: 59 | Source: Ambulatory Visit | Attending: Internal Medicine | Admitting: Internal Medicine

## 2023-04-22 DIAGNOSIS — Z1231 Encounter for screening mammogram for malignant neoplasm of breast: Secondary | ICD-10-CM

## 2023-08-19 ENCOUNTER — Encounter: Payer: 59 | Admitting: Internal Medicine

## 2023-08-26 LAB — HM DIABETES EYE EXAM

## 2023-09-02 ENCOUNTER — Encounter: Payer: Self-pay | Admitting: Internal Medicine

## 2023-09-02 ENCOUNTER — Ambulatory Visit (INDEPENDENT_AMBULATORY_CARE_PROVIDER_SITE_OTHER): Payer: 59 | Admitting: Internal Medicine

## 2023-09-02 VITALS — BP 126/84 | HR 76 | Temp 98.3°F | Resp 16 | Ht 66.0 in | Wt 214.0 lb

## 2023-09-02 DIAGNOSIS — Z0000 Encounter for general adult medical examination without abnormal findings: Secondary | ICD-10-CM | POA: Diagnosis not present

## 2023-09-02 DIAGNOSIS — Z01419 Encounter for gynecological examination (general) (routine) without abnormal findings: Secondary | ICD-10-CM

## 2023-09-02 DIAGNOSIS — E119 Type 2 diabetes mellitus without complications: Secondary | ICD-10-CM

## 2023-09-02 DIAGNOSIS — E559 Vitamin D deficiency, unspecified: Secondary | ICD-10-CM | POA: Diagnosis not present

## 2023-09-02 DIAGNOSIS — Z0001 Encounter for general adult medical examination with abnormal findings: Secondary | ICD-10-CM

## 2023-09-02 DIAGNOSIS — E785 Hyperlipidemia, unspecified: Secondary | ICD-10-CM

## 2023-09-02 NOTE — Assessment & Plan Note (Addendum)
Here for CPX DM: Diet controlled, rechecking labs.  Advised about diet and exercise provided Hyperlipidemia: Never tried atorvastatin, she is somewhat concerned about the side effects, this was discussed in detail, pros> cons. Will check FLP, further advised for results. Vit D deficiency: Not taking meds consistently, recommend 2000 units daily.  Labs. Not on birth control: Knows to notify me if she thinks she is pregnant. RTC 4 months

## 2023-09-02 NOTE — Progress Notes (Signed)
Subjective:    Patient ID: Rachel Goodwin, female    DOB: Aug 24, 1981, 42 y.o.   MRN: [id number]  DOS:  09/02/2023 Type of visit - description: Here for CPX  Here for CPX Chronic medical problems addressed. Not doing well with lifestyle lately  Review of Systems  Other than above, a 14 point review of systems is negative     Past Medical History:  Diagnosis Date   Abortion history    x1   Genital herpes     Past Surgical History:  Procedure Laterality Date   NO PAST SURGERIES     Social History   Socioeconomic History   Marital status: Single    Spouse name: Not on file   Number of children: 0   Years of education: Not on file   Highest education level: Bachelor's degree (e.g., BA, AB, BS)  Occupational History   Occupation: works for a Medical sales representative    Comment: remote   Occupation: has a Doctor, 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[place], community  Tobacco Use   Smoking status: Never   Smokeless tobacco: Never  Substance and Sexual Activity   Alcohol use: Yes    Comment: socially    Drug use: No   Sexual activity: Not on file  Other Topics Concern   Not on file  Social History Narrative   Lives by herself         Social Determinants of Health   Financial Resource Strain: Not on file  Food Insecurity: Patient Declined (03/10/2023)   Hunger Vital Sign    Worried About Running Out of Food in the Last Year: Patient declined    Ran Out of Food in the Last Year: Patient declined  Transportation Needs: No Transportation Needs (03/10/2023)   PRAPARE - Administrator, Civil Service (Medical): No    Lack of Transportation (Non-Medical): No  Physical Activity: Insufficiently Active (03/10/2023)   Exercise Vital Sign    Days of Exercise per Week: 3 days    Minutes of Exercise per Session: 40 min  Stress: No Stress Concern Present (03/10/2023)   [employer] of Occupational Health - Occupational Stress Questionnaire    Feeling of Stress : Not at all  Social Connections: Moderately Isolated  (03/10/2023)   Social Connection and Isolation Panel [NHANES]    Frequency of Communication with Friends and Family: More than three times a week    Frequency of Social Gatherings with Friends and Family: Once a week    Attends Religious Services: 1 to 4 times per year    Active Member of [employer] or Organizations: No    Attends Engineer, structural: Not on file    Marital Status: Never married  Catering manager Violence: Not on file     Current Outpatient Medications  Medication Instructions   ibuprofen (ADVIL) 600 mg, Oral, 3 times daily PRN   valACYclovir (VALTREX) 1000 MG tablet Take 2 tablets by mouth twice daily for 1 day as needed for fever blisters   Vitamin D 2,000 Units, Oral, Daily       Objective:   Physical Exam BP 126/84   Pulse 76   Temp 98.3 F (36.8 C) (Oral)   Resp 16   Ht 5\' 6"  (1.676 m)   Wt 214 lb (97.1 kg)   LMP 08/30/2023 (Exact Date)   SpO2 95%   BMI 34.54 kg/m  General: Well developed, NAD, BMI noted Neck: No  thyromegaly  HEENT:  Normocephalic . Face symmetric, atraumatic Lungs:  CTA B Normal respiratory effort, no intercostal retractions, no accessory muscle use. Heart: RRR,  no murmur.  Abdomen:  Not distended, soft, non-tender. No rebound or rigidity.   DM foot exam: No edema, good pedal pulses, pinprick examination normal  skin: Exposed areas without rash. Not pale. Not jaundice Neurologic:  alert & oriented X3.  Speech normal, gait appropriate for age and unassisted Strength symmetric and appropriate for age.  Psych: Cognition and judgment appear intact.  Cooperative with normal attention span and concentration.  Behavior appropriate. No anxious or depressed appearing.     Assessment    Assessment  Diabetes:   A1c 6.2 (2011), A1c 6.5 (07-2019) Hyperlipidemia  Herpes labialis BC: condoms H/o  Morbid obesity  PLAN: Here for CPX - Td 05-2018 - Rec Covid vax:  booster rec  -Flu shot: declines  - Female care:   referral 2023 failed, will try again;  MMG 03/2023 - CCS: Not indicated -Diet and exercise: Needs to restart healthy diet, also regular exercise, tapes provided, states she plans to do that. - labs:   CMP FLP CBC A1c micro vitamin D DM: Diet controlled, rechecking labs.  Advised about diet and exercise provided Hyperlipidemia: Never tried atorvastatin, she is somewhat concerned about the side effects, this was discussed in detail, pros> cons. Will check FLP, further advised for results. Vit D deficiency: Not taking meds consistently, recommend 2000 units daily.  Labs. Not on birth control: Knows to notify me if she thinks she is pregnant. RTC 4 months

## 2023-09-02 NOTE — Patient Instructions (Addendum)
Referring you to gynecology. Please reach them at: [phone]   Vaccines I recommend: Covid booster Flu shot  Exercise goal: 3 hours a week Watch your diet closely, you can find good information at the [facility] website    GO TO THE LAB : Get the blood work     Next visit with me in 4 months.    Please schedule it at the front desk        Per our records you are due for your diabetic eye exam. Please contact your eye doctor to schedule an appointment. Please have them send copies of your office visit notes to [place]. Our fax number is [phone]. If you need a referral to an eye doctor please let us know.

## 2023-09-02 NOTE — Assessment & Plan Note (Signed)
Here for CPX - Td 05-2018 - Rec Covid vax:  booster rec  -Flu shot: declines  - Female care:  referral 2023 failed, will try again;  MMG 03/2023 - CCS: Not indicated -Diet and exercise: Needs to restart healthy diet, also regular exercise, tapes provided, states she plans to do that. - labs:   CMP FLP CBC A1c micro vitamin D

## 2023-09-03 LAB — LIPID PANEL
Cholesterol: 244 mg/dL — ABNORMAL HIGH (ref 0–200)
HDL: 65 mg/dL (ref >39.00)
LDL Cholesterol: 156 mg/dL — ABNORMAL HIGH (ref 0–99)
NonHDL: 179.07
Total CHOL/HDL Ratio: 4
Triglycerides: 113 mg/dL (ref 0.0–149.0)
VLDL: 22.6 mg/dL (ref 0.0–40.0)

## 2023-09-03 LAB — COMPREHENSIVE METABOLIC PANEL
ALT: 10 U/L (ref 0–35)
AST: 14 U/L (ref 0–37)
Albumin: 4.3 g/dL (ref 3.5–5.2)
Alkaline Phosphatase: 40 U/L (ref 39–117)
BUN: 11 mg/dL (ref 6–23)
CO2: 26 meq/L (ref 19–32)
Calcium: 9.6 mg/dL (ref 8.4–10.5)
Chloride: 102 meq/L (ref 96–112)
Creatinine, Ser: 0.92 mg/dL (ref 0.40–1.20)
GFR: 75.91 mL/min (ref >60.00)
Glucose, Bld: 74 mg/dL (ref 70–99)
Potassium: 4.4 meq/L (ref 3.5–5.1)
Sodium: 136 meq/L (ref 135–145)
Total Bilirubin: 0.5 mg/dL (ref 0.2–1.2)
Total Protein: 7.4 g/dL (ref 6.0–8.3)

## 2023-09-03 LAB — CBC WITH DIFFERENTIAL/PLATELET
Basophils Absolute: 0 10*3/uL (ref 0.0–0.1)
Basophils Relative: 1 % (ref 0.0–3.0)
Eosinophils Absolute: 0.1 10*3/uL (ref 0.0–0.7)
Eosinophils Relative: 2.9 % (ref 0.0–5.0)
HCT: 39 % (ref 36.0–46.0)
Hemoglobin: 12.7 g/dL (ref 12.0–15.0)
Lymphocytes Relative: 48.3 % — ABNORMAL HIGH (ref 12.0–46.0)
Lymphs Abs: 2.2 10*3/uL (ref 0.7–4.0)
MCHC: 32.6 g/dL (ref 30.0–36.0)
MCV: 84 fL (ref 78.0–100.0)
Monocytes Absolute: 0.4 10*3/uL (ref 0.1–1.0)
Monocytes Relative: 8.4 % (ref 3.0–12.0)
Neutro Abs: 1.8 10*3/uL (ref 1.4–7.7)
Neutrophils Relative %: 39.4 % — ABNORMAL LOW (ref 43.0–77.0)
Platelets: 352 10*3/uL (ref 150.0–400.0)
RBC: 4.65 Mil/uL (ref 3.87–5.11)
RDW: 13.7 % (ref 11.5–15.5)
WBC: 4.5 10*3/uL (ref 4.0–10.5)

## 2023-09-03 LAB — VITAMIN D 25 HYDROXY (VIT D DEFICIENCY, FRACTURES): VITD: 12.36 ng/mL — ABNORMAL LOW (ref 30.00–100.00)

## 2023-09-03 LAB — MICROALBUMIN / CREATININE URINE RATIO
Creatinine,U: 77.8 mg/dL
Microalb Creat Ratio: 0.9 mg/g (ref 0.0–30.0)
Microalb, Ur: <0.7 mg/dL (ref 0.0–1.9)

## 2023-09-03 LAB — HEMOGLOBIN A1C: Hgb A1c MFr Bld: 6.2 % (ref 4.6–6.5)

## 2023-09-04 MED ORDER — VITAMIN D (ERGOCALCIFEROL) 1.25 MG (50000 UNIT) PO CAPS: 50000.0000 [IU] | ORAL_CAPSULE | ORAL | 0 refills | Status: AC

## 2023-09-04 NOTE — Addendum Note (Signed)
Addended byConrad Ector D on: 09/04/2023 10:13 AM   Modules accepted: Orders

## 2023-10-29 ENCOUNTER — Encounter: Payer: Self-pay | Admitting: Internal Medicine

## 2023-10-29 DIAGNOSIS — E785 Hyperlipidemia, unspecified: Secondary | ICD-10-CM

## 2023-11-10 MED ORDER — ATORVASTATIN CALCIUM 20 MG PO TABS: 20.0000 mg | ORAL_TABLET | Freq: Every day | ORAL | 0 refills | Status: DC

## 2023-11-10 NOTE — Telephone Encounter (Signed)
Rx sent. Orders placed.  

## 2023-11-10 NOTE — Telephone Encounter (Signed)
 Atorvastatin  20 mg 1 p.o. nightly, 57-month supply FLP AST ALT in 6 weeks

## 2023-11-10 NOTE — Addendum Note (Signed)
 Addended by: Kalyb Pemble D on: 11/10/2023 10:07 AM   Modules accepted: Orders

## 2024-01-06 ENCOUNTER — Encounter: Payer: Self-pay | Admitting: Internal Medicine

## 2024-01-06 ENCOUNTER — Ambulatory Visit (INDEPENDENT_AMBULATORY_CARE_PROVIDER_SITE_OTHER): Payer: 59 | Admitting: Internal Medicine

## 2024-01-06 VITALS — BP 126/80 | HR 83 | Temp 98.2°F | Resp 16 | Ht 66.0 in | Wt 210.1 lb

## 2024-01-06 DIAGNOSIS — E559 Vitamin D deficiency, unspecified: Secondary | ICD-10-CM | POA: Insufficient documentation

## 2024-01-06 DIAGNOSIS — E66811 Obesity, class 1: Secondary | ICD-10-CM

## 2024-01-06 DIAGNOSIS — E785 Hyperlipidemia, unspecified: Secondary | ICD-10-CM

## 2024-01-06 DIAGNOSIS — E119 Type 2 diabetes mellitus without complications: Secondary | ICD-10-CM | POA: Diagnosis not present

## 2024-01-06 LAB — BASIC METABOLIC PANEL WITH GFR
BUN: 10 mg/dL (ref 6–23)
CO2: 24 meq/L (ref 19–32)
Calcium: 9.9 mg/dL (ref 8.4–10.5)
Chloride: 103 meq/L (ref 96–112)
Creatinine, Ser: 0.95 mg/dL (ref 0.40–1.20)
GFR: 72.86 mL/min (ref >60.00)
Glucose, Bld: 100 mg/dL — ABNORMAL HIGH (ref 70–99)
Potassium: 4.7 meq/L (ref 3.5–5.1)
Sodium: 136 meq/L (ref 135–145)

## 2024-01-06 LAB — LIPID PANEL
Cholesterol: 233 mg/dL — ABNORMAL HIGH (ref 0–200)
HDL: 63.9 mg/dL (ref >39.00)
LDL Cholesterol: 155 mg/dL — ABNORMAL HIGH (ref 0–99)
NonHDL: 169.55
Total CHOL/HDL Ratio: 4
Triglycerides: 75 mg/dL (ref 0.0–149.0)
VLDL: 15 mg/dL (ref 0.0–40.0)

## 2024-01-06 LAB — HEMOGLOBIN A1C: Hgb A1c MFr Bld: 6.1 % (ref 4.6–6.5)

## 2024-01-06 NOTE — Patient Instructions (Addendum)
 It was good to see you today. Continue with your healthier diet, try to exercise 3 hours a week. If you are interested on semaglutide please call at your convenience.   Vaccines I recommend: Covid booster Flu shot every fall   GO TO THE LAB : Get the blood work     Please go to the front desk: Arrange for a follow-up in 5 to 6 months

## 2024-01-06 NOTE — Assessment & Plan Note (Signed)
 DM: Last A1c 6.2.  Diet controlled. She is interested GLP-1's.  She qualifies due to history of diabetes and obesity. Pros, cons, cost and insurance issues discussed with the patient. She will let me know if interested. Check A1c High cholesterol: Likes to recheck FLP, never tried atorvastatin.  Statins pros and cons discussed. Vitamin D deficiency, vitamin D was low when she checked the last time she was here, took ergocalciferol, now on OTCs.  Recheck on RTC. Vaccine advised: Declined PNM 20, recommend a flu shot every fall. RTC 5 to 6 months

## 2024-01-06 NOTE — Progress Notes (Signed)
   Subjective:    Patient ID: Rachel Goodwin, female    DOB: April 25, 1981, 43 y.o.   MRN: [id number]  DOS:  01/06/2024 Type of visit - description: Routine follow-up  Chronic medical problems addressed. She is trying to eat healthier and increase her physical activity. Interested on GLP-1's.   Review of Systems See above   Past Medical History:  Diagnosis Date   Abortion history    x1   Genital herpes     Past Surgical History:  Procedure Laterality Date   NO PAST SURGERIES      Current Outpatient Medications  Medication Instructions   ibuprofen (ADVIL) 600 mg, Oral, 3 times daily PRN   valACYclovir (VALTREX) 1000 MG tablet Take 2 tablets by mouth twice daily for 1 day as needed for fever blisters   Vitamin D 2,000 Units, Daily       Objective:   Physical Exam BP 126/80   Pulse 83   Temp 98.2 F (36.8 C) (Oral)   Resp 16   Ht 5\' 6"  (1.676 m)   Wt 210 lb 2 oz (95.3 kg)   LMP 12/16/2023 (Exact Date)   SpO2 97%   BMI 33.92 kg/m  General:   Well developed, NAD, BMI noted. HEENT:  Normocephalic . Face symmetric, atraumatic Lungs:  CTA B Normal respiratory effort, no intercostal retractions, no accessory muscle use. Heart: RRR,  no murmur.  Lower extremities: no pretibial edema bilaterally  Skin: Not pale. Not jaundice Neurologic:  alert & oriented X3.  Speech normal, gait appropriate for age and unassisted Psych--  Cognition and judgment appear intact.  Cooperative with normal attention span and concentration.  Behavior appropriate. No anxious or depressed appearing.      Assessment    Problem list Diabetes:   A1c 6.2 (2011), A1c 6.5 (07-2019) Hyperlipidemia  Herpes labialis BC: condoms H/o  Morbid obesity  PLAN: DM: Last A1c 6.2.  Diet controlled. She is interested GLP-1's.  She qualifies due to history of diabetes and obesity. Pros, cons, cost and insurance issues discussed with the patient. She will let me know if interested. Check A1c High  cholesterol: Likes to recheck FLP, never tried atorvastatin.  Statins pros and cons discussed. Vitamin D deficiency, vitamin D was low when she checked the last time she was here, took ergocalciferol, now on OTCs.  Recheck on RTC. Vaccine advised: Declined PNM 20, recommend a flu shot every fall. RTC 5 to 6 months

## 2024-01-08 ENCOUNTER — Encounter: Payer: Self-pay | Admitting: Internal Medicine

## 2024-01-15 ENCOUNTER — Encounter: Payer: Self-pay | Admitting: Internal Medicine

## 2024-01-21 ENCOUNTER — Encounter: Payer: Self-pay | Admitting: Internal Medicine

## 2024-01-21 DIAGNOSIS — H35413 Lattice degeneration of retina, bilateral: Secondary | ICD-10-CM | POA: Insufficient documentation

## 2024-01-30 ENCOUNTER — Encounter: Payer: Self-pay | Admitting: Internal Medicine

## 2024-03-31 IMAGING — MG MM DIGITAL SCREENING BILAT W/ TOMO AND CAD
8 series · 8 of 24 positions shown · non-contrast
Comparison: Previous exam(s).

CLINICAL DATA: Screening.

EXAM:
DIGITAL SCREENING BILATERAL MAMMOGRAM WITH TOMOSYNTHESIS AND CAD
TECHNIQUE: Bilateral screening digital craniocaudal and mediolateral oblique
mammograms were obtained. Bilateral screening digital breast
tomosynthesis was performed. The images were evaluated with
computer-aided detection.

[L MLO synth-2D]
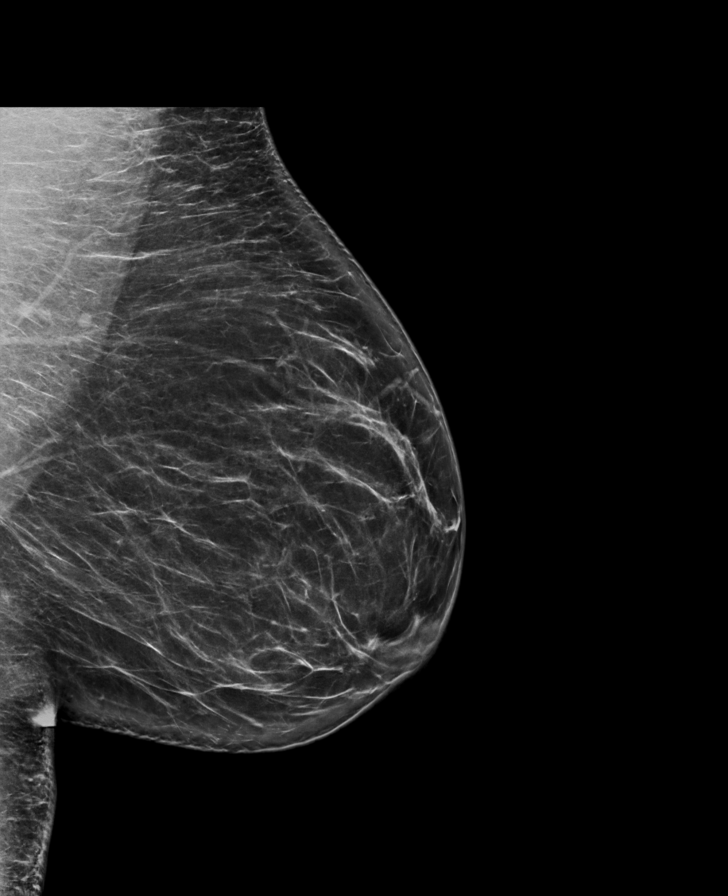

[R MLO synth-2D]
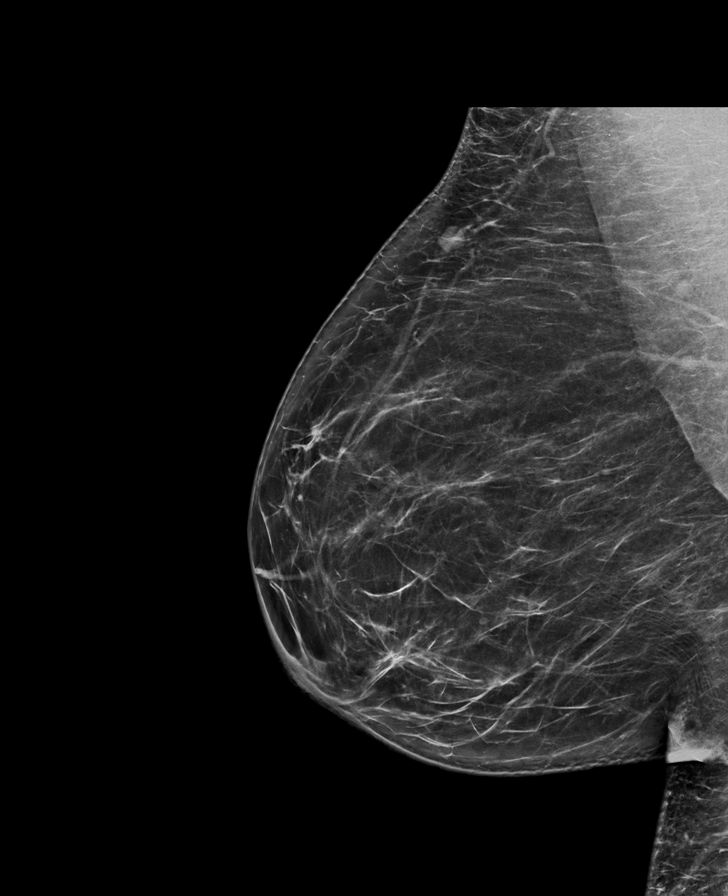

[R CC synth-2D]
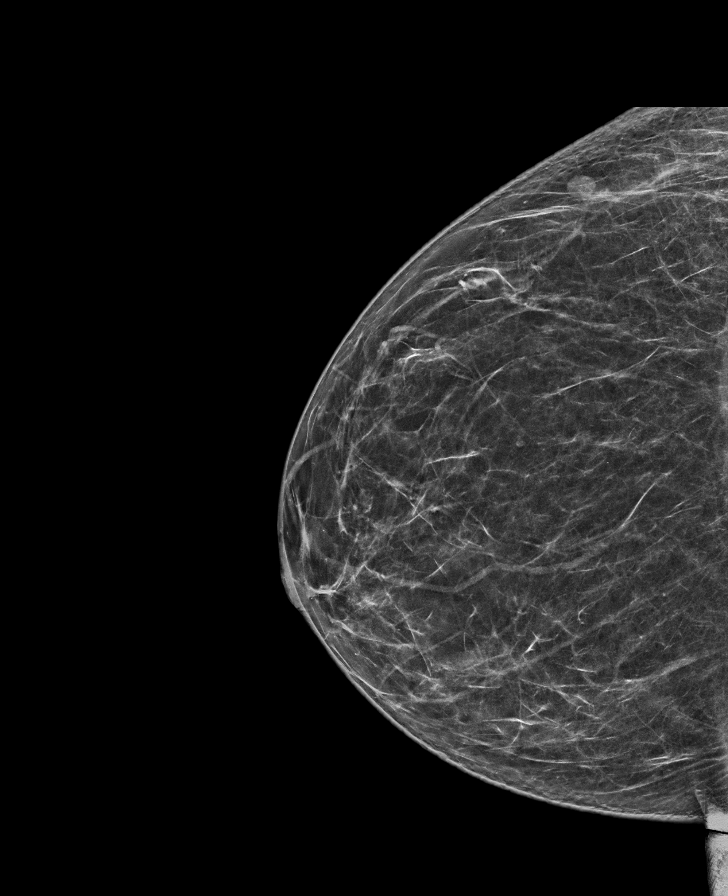

[L CC synth-2D]
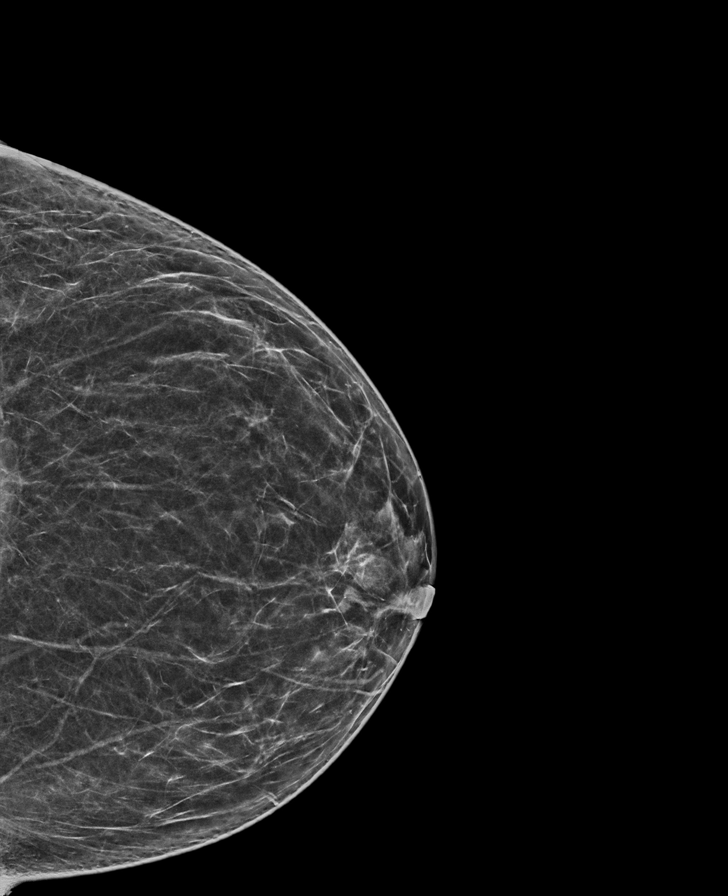

[L CC tomo · tomo slice 35/68.0]
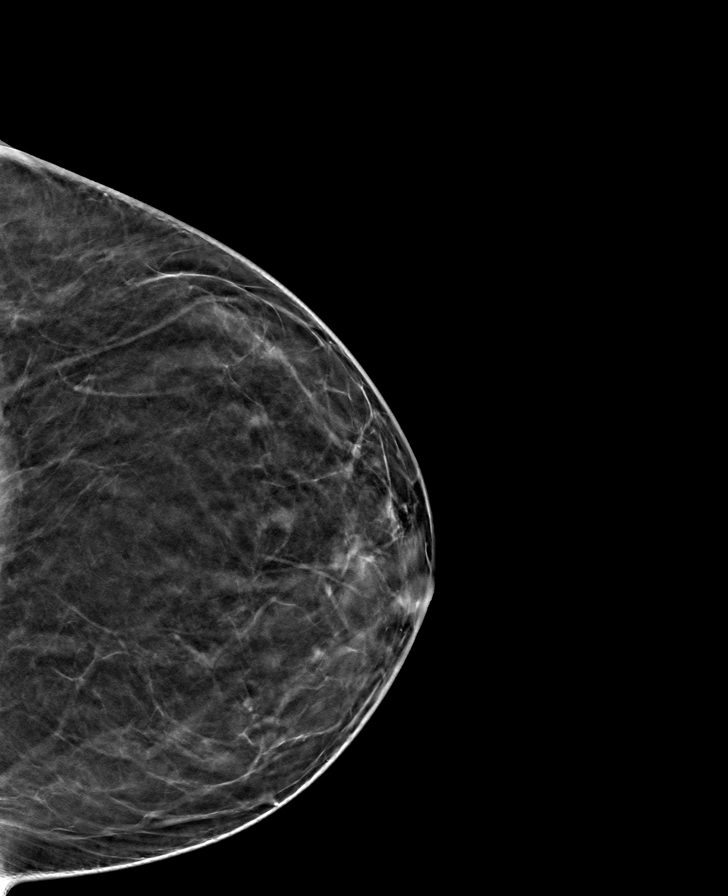

[R CC tomo · tomo slice 35/70.0]
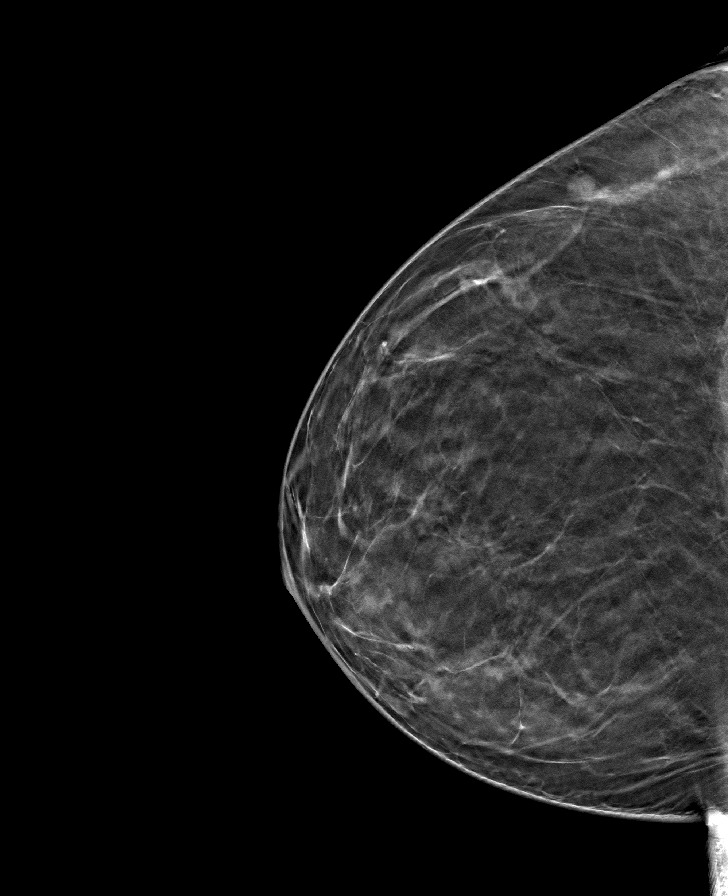

[L MLO tomo · tomo slice 41/82.0]
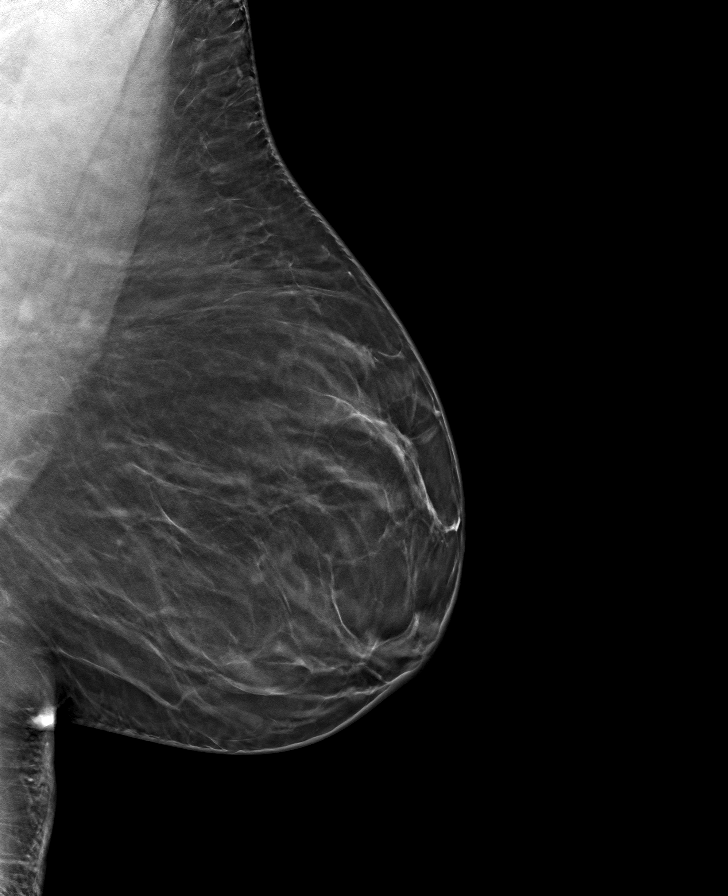

[R MLO tomo · tomo slice 41/82.0]
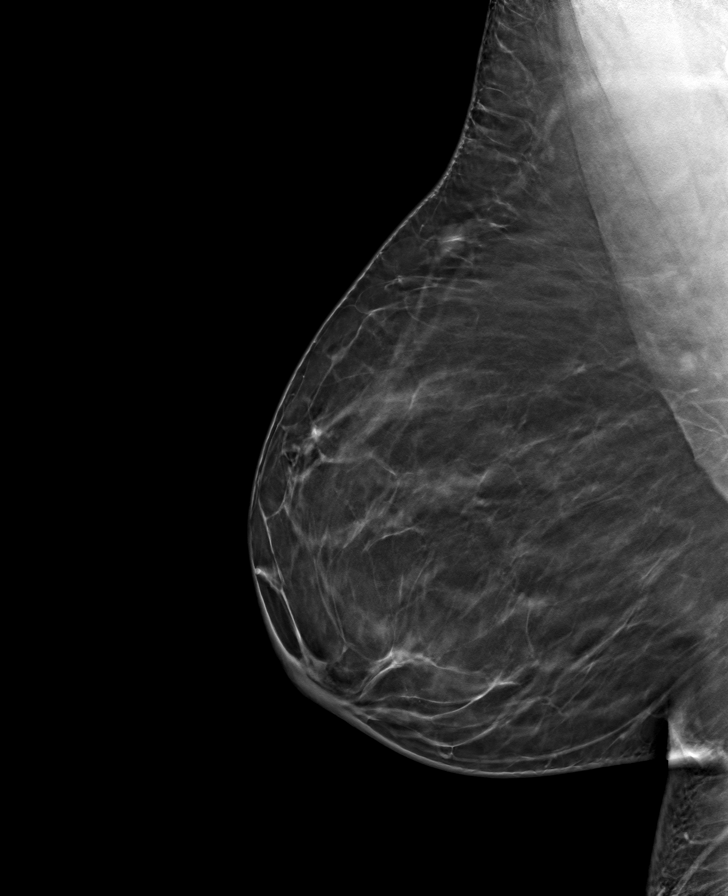

[8 of 24 positions shown; findings below may reference images not displayed]

ACR Breast Density Category b: There are scattered areas of
fibroglandular density.
FINDINGS: There are no findings suspicious for malignancy.
IMPRESSION: No mammographic evidence of malignancy. A result letter of this
screening mammogram will be mailed directly to the patient.

RECOMMENDATION:
Screening mammogram in one year. (Code:51-O-LD2)

BI-RADS CATEGORY  1: Negative.

## 2024-04-20 ENCOUNTER — Other Ambulatory Visit: Payer: Self-pay | Admitting: Internal Medicine

## 2024-04-20 DIAGNOSIS — Z1231 Encounter for screening mammogram for malignant neoplasm of breast: Secondary | ICD-10-CM

## 2024-05-10 ENCOUNTER — Ambulatory Visit
Admission: RE | Admit: 2024-05-10 | Discharge: 2024-05-10 | Disposition: A | Discharge status: Home / Self Care | Source: Ambulatory Visit | Attending: Internal Medicine | Admitting: Internal Medicine

## 2024-05-10 DIAGNOSIS — Z1231 Encounter for screening mammogram for malignant neoplasm of breast: Secondary | ICD-10-CM

## 2024-05-11 ENCOUNTER — Other Ambulatory Visit: Payer: Self-pay | Admitting: Internal Medicine

## 2024-05-11 DIAGNOSIS — M79621 Pain in right upper arm: Secondary | ICD-10-CM

## 2024-05-14 ENCOUNTER — Ambulatory Visit
Admission: RE | Admit: 2024-05-14 | Discharge: 2024-05-14 | Disposition: A | Discharge status: Home / Self Care | Source: Ambulatory Visit | Attending: Internal Medicine | Admitting: Internal Medicine

## 2024-05-14 DIAGNOSIS — M79621 Pain in right upper arm: Secondary | ICD-10-CM

## 2024-06-07 ENCOUNTER — Ambulatory Visit: Admitting: Internal Medicine

## 2024-06-22 ENCOUNTER — Encounter: Payer: Self-pay | Admitting: Internal Medicine

## 2024-06-28 ENCOUNTER — Ambulatory Visit: Admitting: Internal Medicine

## 2024-07-12 ENCOUNTER — Ambulatory Visit (INDEPENDENT_AMBULATORY_CARE_PROVIDER_SITE_OTHER): Admitting: Internal Medicine

## 2024-07-12 VITALS — BP 126/80 | HR 75 | Temp 97.8°F | Resp 16 | Ht 66.0 in | Wt 222.5 lb

## 2024-07-12 DIAGNOSIS — Z6835 Body mass index (BMI) 35.0-35.9, adult: Secondary | ICD-10-CM | POA: Diagnosis not present

## 2024-07-12 DIAGNOSIS — E785 Hyperlipidemia, unspecified: Secondary | ICD-10-CM | POA: Diagnosis not present

## 2024-07-12 DIAGNOSIS — E119 Type 2 diabetes mellitus without complications: Secondary | ICD-10-CM

## 2024-07-12 LAB — BASIC METABOLIC PANEL WITH GFR
BUN: 14 mg/dL (ref 6–23)
CO2: 26 meq/L (ref 19–32)
Calcium: 9.3 mg/dL (ref 8.4–10.5)
Chloride: 103 meq/L (ref 96–112)
Creatinine, Ser: 1.01 mg/dL (ref 0.40–1.20)
GFR: 67.46 mL/min (ref >60.00)
Glucose, Bld: 86 mg/dL (ref 70–99)
Potassium: 4.5 meq/L (ref 3.5–5.1)
Sodium: 136 meq/L (ref 135–145)

## 2024-07-12 LAB — LIPID PANEL
Cholesterol: 234 mg/dL — ABNORMAL HIGH (ref 0–200)
HDL: 74.4 mg/dL (ref >39.00)
LDL Cholesterol: 142 mg/dL — ABNORMAL HIGH (ref 0–99)
NonHDL: 159.52
Total CHOL/HDL Ratio: 3
Triglycerides: 90 mg/dL (ref 0.0–149.0)
VLDL: 18 mg/dL (ref 0.0–40.0)

## 2024-07-12 LAB — HEMOGLOBIN A1C: Hgb A1c MFr Bld: 6.3 % (ref 4.6–6.5)

## 2024-07-12 LAB — MICROALBUMIN / CREATININE URINE RATIO
Creatinine,U: 129.7 mg/dL
Microalb Creat Ratio: 12.7 mg/g (ref 0.0–30.0)
Microalb, Ur: 1.6 mg/dL (ref 0.0–1.9)

## 2024-07-12 NOTE — Patient Instructions (Signed)
 GO TO THE LAB :  Get the blood work    Then, go to the front desk for the checkout Please make an appointment for a physical exam in 4 months

## 2024-07-12 NOTE — Assessment & Plan Note (Signed)
 DM: Diet controlled. Foot exam negative, no neuropathy or other complications. GLP-1 receptor agonists discussed for weight management and glycemic control  Encouraged heart healthy diet and routine exercise, check A1c. High cholesterol: Last LDL  ~ 150s. Discussed cholesterol-lowering medication benefits, but she is hesitant to start medication. Emphasized future cardiovascular health protection with statins.  Recheck FLP -- requested by her insurance. Morbid obesity: BMI 35, DM and dyslipidemia.  Currently diet controlled Vitamin D  deficiency Not taking supplements, encouraged 2000 units daily.    General Health Maintenance Discussed importance of flu and COVID vaccinations due to diabetes and weight status, which may increase illness severity. Patient will think about it. RTC CPX 4 months

## 2024-07-12 NOTE — Progress Notes (Signed)
   Subjective:    Patient ID: Rachel Goodwin, female    DOB: [birth year], 43 y.o.   MRN: [id number]  DOS:  07/12/2024 Follow-up   Discussed the use of AI scribe software for clinical note transcription with the patient, who gave verbal consent to proceed.  History of Present Illness  Glycemic control - Currently diet controlled. - Diet is inconsistent, particularly following a recent birthday celebration - No numbness in feet  Hyperlipidemia - Last cholesterol check showed LDL in the 150s - Undecided about initiating cholesterol-lowering medication  Physical activity - Attempts to walk three times per week - Exercise intensity has decreased compared to six months ago - Maintains a gym membership for winter exercise  Cardiopulmonary symptoms - No chest pain - No respiratory difficulties - No lower extremity edema  Medication and supplement use - Occasional use of ibuprofen  - Not taking vitamin D  recently.  Immunization status - Considering influenza and COVID-19 vaccinations   Review of Systems See above   Past Medical History:  Diagnosis Date   Abortion history    x1   Genital herpes    Lattice degeneration of both retinas     Past Surgical History:  Procedure Laterality Date   RETINAL LASER PROCEDURE Right 10/18/2011   laser retinopexy   RETINAL LASER PROCEDURE Left 08/12/2011   laser retinopexy    Current Outpatient Medications  Medication Instructions   ibuprofen  (ADVIL ) 600 mg, Oral, 3 times daily PRN   valACYclovir  (VALTREX ) 1000 MG tablet Take 2 tablets by mouth twice daily for 1 day as needed for fever blisters   Vitamin D  2,000 Units, Daily       Objective:   Physical Exam BP 126/80   Pulse 75   Temp 97.8 F (36.6 C) (Oral)   Resp 16   Ht 5' 6 (1.676 m)   Wt 222 lb 8 oz (100.9 kg)   LMP 06/20/2024 (Exact Date)   SpO2 95%   BMI 35.91 kg/m  General:   Well developed, NAD, BMI noted. HEENT:  Normocephalic . Face symmetric,  atraumatic Lungs:  CTA B Normal respiratory effort, no intercostal retractions, no accessory muscle use. Heart: RRR,  no murmur.  DM foot exam: No edema, good pedal pulses, pinprick examination normal Skin: Not pale. Not jaundice Neurologic:  alert & oriented X3.  Speech normal, gait appropriate for age and unassisted Psych--  Cognition and judgment appear intact.  Cooperative with normal attention span and concentration.  Behavior appropriate. No anxious or depressed appearing.      Assessment     Problem list Diabetes:   A1c 6.2 (2011), A1c 6.5 (07-2019) Hyperlipidemia  Herpes labialis BC: condoms H/o  Morbid obesity  Assessment & Plan DM: Diet controlled. Foot exam negative, no neuropathy or other complications. GLP-1 receptor agonists discussed for weight management and glycemic control  Encouraged heart healthy diet and routine exercise, check A1c. High cholesterol: Last LDL  ~ 150s. Discussed cholesterol-lowering medication benefits, but she is hesitant to start medication. Emphasized future cardiovascular health protection with statins.  Recheck FLP -- requested by her insurance. Morbid obesity: BMI 35, DM and dyslipidemia.  Currently diet controlled Vitamin D  deficiency Not taking supplements, encouraged 2000 units daily.    General Health Maintenance Discussed importance of flu and COVID vaccinations due to diabetes and weight status, which may increase illness severity. Patient will think about it. RTC CPX 4 months

## 2024-07-13 ENCOUNTER — Ambulatory Visit: Payer: Self-pay | Admitting: Internal Medicine

## 2024-07-13 ENCOUNTER — Telehealth: Payer: Self-pay

## 2024-07-13 NOTE — Telephone Encounter (Signed)
Physical form completed and faxed back to [employer] at [phone]. Form sent for scanning.

## 2024-11-01 ENCOUNTER — Encounter: Admitting: Internal Medicine

## 2024-12-24 ENCOUNTER — Encounter: Admitting: Internal Medicine
